# Patient Record
Sex: Female | Born: 1995 | Race: White | Hispanic: No | Marital: Single | Smoking: Never smoker
Health system: Southern US, Community
[De-identification: ages and names within clinical notes are randomized; demographics above are authoritative.]

## PROBLEM LIST (undated history)

## (undated) DIAGNOSIS — R519 Headache, unspecified: Secondary | ICD-10-CM

## (undated) DIAGNOSIS — F419 Anxiety disorder, unspecified: Secondary | ICD-10-CM

## (undated) DIAGNOSIS — T7840XA Allergy, unspecified, initial encounter: Secondary | ICD-10-CM

## (undated) HISTORY — DX: Allergy, unspecified, initial encounter: T78.40XA

## (undated) HISTORY — DX: Anxiety disorder, unspecified: F41.9

---
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
## 2017-08-23 LAB — HEPATIC FUNCTION PANEL
ALT: 8 (ref 7–35)
AST: 13 (ref 13–35)
Alkaline Phosphatase: 49 (ref 25–125)
Bilirubin, Total: 0.3

## 2017-08-23 LAB — CBC AND DIFFERENTIAL
HCT: 43 (ref 36–46)
Hemoglobin: 14.2 (ref 12.0–16.0)
Platelets: 291 (ref 150–399)
WBC: 6.4

## 2017-08-23 LAB — BASIC METABOLIC PANEL
BUN: 9 (ref 4–21)
Creatinine: 0.7 (ref 0.5–1.1)
Glucose: 67
Potassium: 3.9 (ref 3.4–5.3)
Sodium: 142 (ref 137–147)

## 2017-08-23 LAB — LIPID PANEL
Cholesterol: 195 (ref 0–200)
HDL: 67 (ref 35–70)
LDL Cholesterol: 110
Triglycerides: 85 (ref 40–160)

## 2017-08-23 LAB — IRON,TIBC AND FERRITIN PANEL
%SAT: 19
Ferritin: 36
Iron: 80
TIBC: 426
UIBC: 346

## 2017-08-23 LAB — VITAMIN B12: Vitamin B-12: 602

## 2017-08-23 LAB — TSH: TSH: 2.8 (ref 0.41–5.90)

## 2017-08-23 LAB — HEMOGLOBIN A1C: Hemoglobin A1C: 5.3

## 2017-08-23 LAB — VITAMIN D 25 HYDROXY (VIT D DEFICIENCY, FRACTURES): Vit D, 25-Hydroxy: 35.2

## 2018-04-15 ENCOUNTER — Telehealth: Payer: Self-pay

## 2018-04-15 NOTE — Telephone Encounter (Signed)
Copied from CRM [id number]. Topic: General - Other >> Apr 15, 2018  4:20 PM Jilda Roche wrote: Reason for CRM: Patient's mom, Makayla Calderon is a patient of Dr. Melina Schools , she is trying to establish care for her daughter and would like to know if she would take her as a new patient, please advise  Best call back is [phone] this is mom's number

## 2018-04-21 NOTE — Telephone Encounter (Signed)
Yes.  I will accept her daughter.   

## 2018-05-26 ENCOUNTER — Ambulatory Visit: Payer: BC Managed Care – PPO | Admitting: Internal Medicine

## 2018-05-26 ENCOUNTER — Other Ambulatory Visit: Payer: Self-pay

## 2018-05-26 ENCOUNTER — Encounter: Payer: Self-pay | Admitting: Internal Medicine

## 2018-05-26 DIAGNOSIS — F4010 Social phobia, unspecified: Secondary | ICD-10-CM | POA: Diagnosis not present

## 2018-05-26 DIAGNOSIS — K581 Irritable bowel syndrome with constipation: Secondary | ICD-10-CM | POA: Diagnosis not present

## 2018-05-26 DIAGNOSIS — Z3041 Encounter for surveillance of contraceptive pills: Secondary | ICD-10-CM | POA: Diagnosis not present

## 2018-05-26 MED ORDER — PAROXETINE HCL 10 MG PO TABS: 10.0000 mg | ORAL_TABLET | Freq: Every day | ORAL | 2 refills | Status: DC

## 2018-05-26 MED ORDER — PROPRANOLOL HCL 10 MG PO TABS: 5.0000 mg | ORAL_TABLET | Freq: Three times a day (TID) | ORAL | 0 refills | Status: DC

## 2018-05-26 NOTE — Patient Instructions (Addendum)
Your intermittent rectal bleeding sounds like you may have hemorrhoids from being constipated  Drink 60 ounces of water daily minimum to keep our  bowels hydrated  You can  use colace and or citrucel daily ,  And it's ok to try or add  magnesium capsules maximum 1 daily  (try 1 mag capsule and one colace)  Propranolol 1/2 tablet as needed before stressful situations (stage fright,  Etc)  Trial of generic Paxil for Generalized anxiety disorder:  Start with 1/2 tablet after dinner  Daily  for the first week.  Increase to a full tablet after one week if tolerating   Your bowel symptoms sound like Irritable Bowel syndrome  (see below)   Irritable Bowel Syndrome, Adult  Irritable bowel syndrome (IBS) is a group of symptoms that affects the organs responsible for digestion (gastrointestinal or GI tract). IBS is not one specific disease. To regulate how the GI tract works, the body sends signals back and forth between the intestines and the brain. If you have IBS, there may be a problem with these signals. As a result, the GI tract does not function normally. The intestines may become more sensitive and overreact to certain things. This may be especially true when you eat certain foods or when you are under stress. There are four types of IBS. These may be determined based on the consistency of your stool (feces):  IBS with diarrhea.  IBS with constipation.  Mixed IBS.  Unsubtyped IBS. It is important to know which type of IBS you have. Certain treatments are more likely to be helpful for certain types of IBS. What are the causes? The exact cause of IBS is not known. What increases the risk? You may have a higher risk for IBS if you:  Are female.  Are younger than 80.  Have a family history of IBS.  Have a mental health condition, such as depression, anxiety, or post-traumatic stress disorder.  Have had a bacterial infection of your GI tract. What are the signs or  symptoms? Symptoms of IBS vary from person to person. The main symptom is abdominal pain or discomfort. Other symptoms usually include one or more of the following:  Diarrhea, constipation, or both.  Abdominal swelling or bloating.  Feeling full after eating a small or regular-sized meal.  Frequent gas.  Mucus in the stool.  A feeling of having more stool left after a bowel movement. Symptoms tend to come and go. They may be triggered by stress, mental health conditions, or certain foods. How is this diagnosed? This condition may be diagnosed based on a physical exam, your medical history, and your symptoms. You may have tests, such as:  Blood tests.  Stool test.  X-rays.  CT scan.  Colonoscopy. This is a procedure in which your GI tract is viewed with a long, thin, flexible tube. How is this treated? There is no cure for IBS, but treatment can help relieve symptoms. Treatment depends on the type of IBS you have, and may include:  Changes to your diet, such as: ? Avoiding foods that cause symptoms. ? Drinking more water. ? Following a low-FODMAP (fermentable oligosaccharides, disaccharides, monosaccharides, and polyols) diet for up to 6 weeks, or as told by your health care provider. FODMAPs are sugars that are hard for some people to digest. ? Eating more fiber. ? Eating medium-sized meals at the same times every day.  Medicines. These may include: ? Fiber supplements, if you have constipation. ? Medicine to control  diarrhea (antidiarrheal medicines). ? Medicine to help control muscle tightening (spasms) in your GI tract (antispasmodic medicines). ? Medicines to help with mental health conditions, such as antidepressants or tranquilizers.  Talk therapy or counseling.  Working with a diet and nutrition specialist (dietitian) to help create a food plan that is right for you.  Managing your stress. Follow these instructions at home: Eating and drinking  Eat a healthy  diet.  Eat medium-sized meals at about the same time every day. Do not eat large meals.  Gradually eat more fiber-rich foods. These include whole grains, fruits, and vegetables. This may be especially helpful if you have IBS with constipation.  Eat a diet low in FODMAPs.  Drink enough fluid to keep your urine pale yellow.  Keep a journal of foods that seem to trigger symptoms.  Avoid foods and drinks that: ? Contain added sugar. ? Make your symptoms worse. Dairy products, caffeinated drinks, and carbonated drinks can make symptoms worse for some people. General instructions  Take over-the-counter and prescription medicines and supplements only as told by your health care provider.  Get enough exercise. Do at least 150 minutes of moderate-intensity exercise each week.  Manage your stress. Getting enough sleep and exercise can help you manage stress.  Keep all follow-up visits as told by your health care provider and therapist. This is important. Alcohol Use  Do not drink alcohol if: ? Your health care provider tells you not to drink. ? You are pregnant, may be pregnant, or are planning to become pregnant.  If you drink alcohol, limit how much you have: ? 0-1 drink a day for women. ? 0-2 drinks a day for men.  Be aware of how much alcohol is in your drink. In the [place] one drink equals one typical bottle of beer (12 oz), one-half glass of wine (5 oz), or one shot of hard liquor (1 oz). Contact a health care provider if you have:  Constant pain.  Weight loss.  Difficulty or pain when swallowing.  Diarrhea that gets worse. Get help right away if you have:  Severe abdominal pain.  Fever.  Diarrhea with symptoms of dehydration, such as dizziness or dry mouth.  Bright red blood in your stool.  Stool that is black and tarry.  Abdominal swelling.  Vomiting that does not stop.  Blood in your vomit. Summary  Irritable bowel syndrome (IBS) is not one specific  disease. It is a group of symptoms that affects digestion.  Your intestines may become more sensitive and overreact to certain things. This may be especially true when you eat certain foods or when you are under stress.  There is no cure for IBS, but treatment can help relieve symptoms. This information is not intended to replace advice given to you by your health care provider. Make sure you discuss any questions you have with your health care provider. Document Released: 02/12/2005 Document Revised: 02/05/2017 Document Reviewed: 02/05/2017 Elsevier Interactive Patient Education  2019 [employer].

## 2018-05-26 NOTE — Progress Notes (Signed)
Subjective:  Patient ID: Makayla Calderon, female    DOB: 03-05-1995  Age: 23 y.o. MRN: [id number]  CC: Diagnoses of Social anxiety disorder, Irritable bowel syndrome with constipation, and Oral contraceptive use were pertinent to this visit.  HPI  Makayla Calderon presents for new patient evaluation, referred by her mother Makayla Calderon.   She states that she has fully recoveredfrom a cold last week that presented with rhinitis and cough but without fever , body aches and dyspnea,  She denies any recent travel or exposure to infected patients and has been staying at home for the last 2 weeks since the restrictions went into place.   1) History of recurrent stomach aches dating back at least 5 years, (per ER records). History of constipation with occasional streaks of scant blood when wiping after hard stools.  Stool patterns and passage improves if she uses a stool softener , takes it every other day . Has not ad to use an  enema in years . No history of anemia,  Labs checked by fomrer PCP pediatrician at [facility]  Dr Olegario Shearer less than one year ago.   2) chronic anxiety disorder;  Mostly social anxiety ,  Occasionally stutters  or gets "tongue tied " at times.  Afraid of speaking to groups larger than 3 or 4 . Also gets  nervous making left turns and going through drive through's.   No history of trauma or MVA. "I've always been a nervous person." mom thinks she needs help .  previous PCP Landon offered psychotherapy but patient deferred due to work schedule .  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     3) using OCP to regulate periods .  Not sexually active.  Not sure if she had an HPV vaccine.  Taking Sprintec  By Dr Olegario Shearer.       Objective:  BP 136/68 (BP Location: Right Arm, Patient Position: Sitting, Cuff Size: Normal)   Pulse 90   Temp 98.7 F (37.1 C) (Oral)   Resp 15   Ht 5\' 5"  (1.651 m)   Wt 143 lb 6.4 oz  (65 kg)   SpO2 98%   BMI 23.86 kg/m   Physical Exam:  General appearance: alert, cooperative and appears stated age Ears: normal TM's and external ear canals both ears Throat: lips, mucosa, and tongue normal; teeth and gums normal Neck: no adenopathy, no carotid bruit, supple, symmetrical, trachea midline and thyroid not enlarged, symmetric, no tenderness/mass/nodules Back: symmetric, no curvature. ROM normal. No CVA tenderness. Lungs: clear to auscultation bilaterally Heart: regular rate and rhythm, S1, S2 normal, no murmur, click, rub or gallop Abdomen: soft, non-tender; bowel sounds normal; no masses,  no organomegaly Pulses: 2+ and symmetric Skin: Skin color, texture, turgor normal. No rashes or lesions Lymph nodes: Cervical, supraclavicular, and axillary nodes normal.  Assessment & Plan:   Problem List Items Addressed This Visit    Social anxiety disorder    Trial of low dose paroxetine starting with 5 mg qhs for first week only, then advance to 10 mg.  Adding prn use of propranolol for use in public speaking       Relevant Medications   PARoxetine (PAXIL) 10 MG tablet   Irritable bowel syndrome (IBS)    Constipation predominant.  Reassess in 3 months after starting paxil and adding citrucel.  May benefit form linzess.  Relevant Medications   docusate sodium (COLACE) 100 MG capsule   Oral contraceptive use    Used to maintain regular periods.  No history of sexual activity . Does not use tampons either . Will postpone first PAP smear until next year and refer to Dr Leeroy Bock Ward  at that time .  Annual liver enzyme assessment  needed in summer.           I am having Makayla Calderon start on propranolol and PARoxetine. I am also having her maintain her Sprintec 28, fluticasone, docusate sodium, cetirizine, and Multiple Vitamins-Minerals (MULTIVITAMIN ADULT PO).  Meds ordered this encounter  Medications  . propranolol (INDERAL) 10 MG tablet    Sig: Take 0.5 tablets  (5 mg total) by mouth 3 (three) times daily. As needed for rapid heart rate    Dispense:  30 tablet    Refill:  0  . PARoxetine (PAXIL) 10 MG tablet    Sig: Take 1 tablet (10 mg total) by mouth daily.    Dispense:  30 tablet    Refill:  2    There are no discontinued medications.  Follow-up: Return in about 3 months (around 08/26/2018) for annual exam,  no PAP.   Sherlene Shams, MD

## 2018-05-27 DIAGNOSIS — F4010 Social phobia, unspecified: Secondary | ICD-10-CM | POA: Insufficient documentation

## 2018-05-27 DIAGNOSIS — Z3041 Encounter for surveillance of contraceptive pills: Secondary | ICD-10-CM | POA: Insufficient documentation

## 2018-05-27 DIAGNOSIS — K589 Irritable bowel syndrome without diarrhea: Secondary | ICD-10-CM | POA: Insufficient documentation

## 2018-05-27 NOTE — Assessment & Plan Note (Addendum)
Used to maintain regular periods.  No history of sexual activity . Does not use tampons either . Will postpone first PAP smear until next year and refer to Dr Leeroy Bock Ward  at that time .  Annual liver enzyme assessment  needed in summer.

## 2018-05-27 NOTE — Assessment & Plan Note (Signed)
Trial of low dose paroxetine starting with 5 mg qhs for first week only, then advance to 10 mg.  Adding prn use of propranolol for use in public speaking

## 2018-05-27 NOTE — Assessment & Plan Note (Signed)
Constipation predominant.  Reassess in 3 months after starting paxil and adding citrucel.  May benefit form linzess.

## 2018-06-08 ENCOUNTER — Encounter: Payer: Self-pay | Admitting: Internal Medicine

## 2018-06-08 ENCOUNTER — Telehealth: Payer: Self-pay | Admitting: Internal Medicine

## 2018-06-08 DIAGNOSIS — Z3041 Encounter for surveillance of contraceptive pills: Secondary | ICD-10-CM

## 2018-06-18 ENCOUNTER — Other Ambulatory Visit: Payer: Self-pay | Admitting: Internal Medicine

## 2018-07-12 ENCOUNTER — Other Ambulatory Visit: Payer: Self-pay | Admitting: Internal Medicine

## 2018-08-25 ENCOUNTER — Telehealth: Payer: Self-pay

## 2018-08-25 NOTE — Telephone Encounter (Signed)
LMTCB. Need to screen pt for her in office appt tomorrow with Dr. Derrel Nip.

## 2018-08-26 ENCOUNTER — Encounter: Payer: Self-pay | Admitting: Internal Medicine

## 2018-08-26 ENCOUNTER — Ambulatory Visit (INDEPENDENT_AMBULATORY_CARE_PROVIDER_SITE_OTHER): Payer: BC Managed Care – PPO | Admitting: Internal Medicine

## 2018-08-26 ENCOUNTER — Other Ambulatory Visit: Payer: Self-pay

## 2018-08-26 VITALS — BP 104/72 | HR 100 | Temp 98.3°F | Resp 14 | Ht 65.0 in | Wt 143.0 lb

## 2018-08-26 DIAGNOSIS — Z3041 Encounter for surveillance of contraceptive pills: Secondary | ICD-10-CM

## 2018-08-26 DIAGNOSIS — K581 Irritable bowel syndrome with constipation: Secondary | ICD-10-CM | POA: Diagnosis not present

## 2018-08-26 DIAGNOSIS — F4010 Social phobia, unspecified: Secondary | ICD-10-CM

## 2018-08-26 DIAGNOSIS — Z0000 Encounter for general adult medical examination without abnormal findings: Secondary | ICD-10-CM

## 2018-08-26 NOTE — Patient Instructions (Addendum)
I'm glad the Paxil is helping!  You can stay on it as long as you feel it is helping.    There are 4 categories of laxatives.  They can be combined,  But some should not be used daily .  Bulk  forming laxatives   Ok to use daily  Citrucel, benefiber, metamucil, Fibercon, miralax)  Stool softener (docusate ; there's only one) :  ok to use daily    Stimulant laxatives (Ex Lax,  Correctol,  Senna,  Dulcolax)  :  avoid on a daily basis, not more than 2/weel   Cathartic laxatives ( MOM,  Mag citrate, Lactulose ) : not more than 2 /week    Ok to take plain  magnesium in capsule form   With or without the stool softener daily     Health Maintenance, Female Adopting a healthy lifestyle and getting preventive care are important in promoting health and wellness. Ask your health care provider about:  The right schedule for you to have regular tests and exams.  Things you can do on your own to prevent diseases and keep yourself healthy. What should I know about diet, weight, and exercise? Eat a healthy diet   Eat a diet that includes plenty of vegetables, fruits, low-fat dairy products, and lean protein.  Do not eat a lot of foods that are high in solid fats, added sugars, or sodium. Maintain a healthy weight Body mass index (BMI) is used to identify weight problems. It estimates body fat based on height and weight. Your health care provider can help determine your BMI and help you achieve or maintain a healthy weight. Get regular exercise Get regular exercise. This is one of the most important things you can do for your health. Most adults should:  Exercise for at least 150 minutes each week. The exercise should increase your heart rate and make you sweat (moderate-intensity exercise).  Do strengthening exercises at least twice a week. This is in addition to the moderate-intensity exercise.  Spend less time sitting. Even light physical activity can be beneficial. Watch cholesterol and  blood lipids Have your blood tested for lipids and cholesterol at 21 years of age, then have this test every 5 years. Have your cholesterol levels checked more often if:  Your lipid or cholesterol levels are high.  You are older than 21 years of age.  You are at high risk for heart disease. What should I know about cancer screening? Depending on your health history and family history, you may need to have cancer screening at various ages. This may include screening for:  Breast cancer.  Cervical cancer.  Colorectal cancer.  Skin cancer.  Lung cancer. What should I know about heart disease, diabetes, and high blood pressure? Blood pressure and heart disease  High blood pressure causes heart disease and increases the risk of stroke. This is more likely to develop in people who have high blood pressure readings, are of African descent, or are overweight.  Have your blood pressure checked: ? Every 3-5 years if you are 6518-21 years of age. ? Every year if you are 412 years old or older. Diabetes Have regular diabetes screenings. This checks your fasting blood sugar level. Have the screening done:  Once every three years after age 21 if you are at a normal weight and have a low risk for diabetes.  More often and at a younger age if you are overweight or have a high risk for diabetes. What should I know  about preventing infection? Hepatitis B If you have a higher risk for hepatitis B, you should be screened for this virus. Talk with your health care provider to find out if you are at risk for hepatitis B infection. Hepatitis C Testing is recommended for:  Everyone born from [birth year] through 1963.  Anyone with known risk factors for hepatitis C. Sexually transmitted infections (STIs)  Get screened for STIs, including gonorrhea and chlamydia, if: ? You are sexually active and are younger than 21 years of age. ? You are older than 21 years of age and your health care provider tells  you that you are at risk for this type of infection. ? Your sexual activity has changed since you were last screened, and you are at increased risk for chlamydia or gonorrhea. Ask your health care provider if you are at risk.  Ask your health care provider about whether you are at high risk for HIV. Your health care provider may recommend a prescription medicine to help prevent HIV infection. If you choose to take medicine to prevent HIV, you should first get tested for HIV. You should then be tested every 3 months for as long as you are taking the medicine. Pregnancy  If you are about to stop having your period (premenopausal) and you may become pregnant, seek counseling before you get pregnant.  Take 400 to 800 micrograms (mcg) of folic acid every day if you become pregnant.  Ask for birth control (contraception) if you want to prevent pregnancy. Osteoporosis and menopause Osteoporosis is a disease in which the bones lose minerals and strength with aging. This can result in bone fractures. If you are 80 years old or older, or if you are at risk for osteoporosis and fractures, ask your health care provider if you should:  Be screened for bone loss.  Take a calcium or vitamin D supplement to lower your risk of fractures.  Be given hormone replacement therapy (HRT) to treat symptoms of menopause. Follow these instructions at home: Lifestyle  Do not use any products that contain nicotine or tobacco, such as cigarettes, e-cigarettes, and chewing tobacco. If you need help quitting, ask your health care provider.  Do not use street drugs.  Do not share needles.  Ask your health care provider for help if you need support or information about quitting drugs. Alcohol use  Do not drink alcohol if: ? Your health care provider tells you not to drink. ? You are pregnant, may be pregnant, or are planning to become pregnant.  If you drink alcohol: ? Limit how much you use to 0-1 drink a  day. ? Limit intake if you are breastfeeding.  Be aware of how much alcohol is in your drink. In the [place] one drink equals one 12 oz bottle of beer (355 mL), one 5 oz glass of wine (148 mL), or one 1 oz glass of hard liquor (44 mL). General instructions  Schedule regular health, dental, and eye exams.  Stay current with your vaccines.  Tell your health care provider if: ? You often feel depressed. ? You have ever been abused or do not feel safe at home. Summary  Adopting a healthy lifestyle and getting preventive care are important in promoting health and wellness.  Follow your health care provider's instructions about healthy diet, exercising, and getting tested or screened for diseases.  Follow your health care provider's instructions on monitoring your cholesterol and blood pressure. This information is not intended to replace advice given to  you by your health care provider. Make sure you discuss any questions you have with your health care provider. Document Released: 08/28/2010 Document Revised: 02/05/2018 Document Reviewed: 02/05/2018 Elsevier Patient Education  2020 [employer]

## 2018-08-26 NOTE — Progress Notes (Signed)
Patient ID: Makayla Calderon, female    DOB: 30-Aug-1995  Age: 23 y.o. MRN: [id number]  The patient is here for annual preventive  examination and management of other chronic and acute problems.   The risk factors are reflected in the social history.  The roster of all physicians providing medical care to patient - is listed in the Snapshot section of the chart.  Activities of daily living:  The patient is 100% independent in all ADLs: dressing, toileting, feeding as well as independent mobility  Home safety : The patient has smoke detectors in the home. They wear seatbelts.  There are no firearms at home. There is no violence in the home.   There is no risks for hepatitis, STDs or HIV. There is no   history of blood transfusion. They have no travel history to infectious disease endemic areas of the world.  The patient has seen their dentist in the last six month. They have seen their eye doctor in the last year.   They do not  have excessive sun exposure. Discussed the need for sun protection: hats, long sleeves and use of sunscreen if there is significant sun exposure. She does not smoke, use illicit drugs, or drink more than one alcoholic beverage per night   Diet: the importance of a healthy diet is discussed. They do have a healthy diet.  The benefits of regular aerobic exercise were discussed. She exercises  4  times per week ,  For about 60 minutes.   Depression screen: there are no signs or vegative symptoms of depression- irritability, change in appetite, anhedonia, sadness/tearfullness.   The following portions of the patient's history were reviewed and updated as appropriate: allergies, current medications, past family history, past medical history,  past surgical history, past social history  and problem list.  Visual acuity was not assessed per patient preference since she has regular follow up with her ophthalmologist. Hearing and body mass index were assessed and reviewed.   CC:  The primary encounter diagnosis was Oral contraceptive use. Diagnoses of Irritable bowel syndrome with constipation, Social anxiety disorder, and Encounter for preventive health examination were also pertinent to this visit.   Social anxiety:  At her last (first ) visit in march she was prescribed paroxetine for daily use and low dose propanolol for prn use during presentations.  She states that initially the paxil made her sleepy.  But the side effect resolve and she is tolerating and  benefiting from it. . She has used the beta blocker once thus far and it helped.  She has been out of school since late March because of the pandemic but is working at the [employer] using social distancing and masking.  Bowels moving better with a daily stool softener. No rectal bleeding.   [name] has a past medical history of Allergy.   She has a past surgical history that includes Tonsillectomy (2006).   Her family history includes Arthritis in her maternal grandmother; Asthma in her maternal grandmother and mother; Cancer in her brother, father, maternal grandfather, maternal grandmother, and paternal grandmother; Diabetes in her maternal grandfather; Early death in her paternal grandfather; Hearing loss in her maternal grandmother; Heart attack in her paternal grandfather and paternal grandmother; Heart disease in her maternal grandfather; Hyperlipidemia in her maternal grandfather and maternal grandmother; Hypertension in her maternal grandfather and maternal grandmother; Kidney disease in her paternal grandmother.She reports that she has never smoked. She has never used smokeless tobacco. She  reports current alcohol use. She reports that she does not use drugs.  Outpatient Medications Prior to Visit  Medication Sig Dispense Refill  . cetirizine (ZYRTEC) 10 MG tablet Take 10 mg by mouth daily.    Marland Kitchen docusate sodium (COLACE) 100 MG capsule Take 100 mg by mouth 2 (two) times daily.    .  fluticasone (FLONASE) 50 MCG/ACT nasal spray Place 1 spray into both nostrils daily.    . Multiple Vitamins-Minerals (MULTIVITAMIN ADULT PO) Take 1 tablet by mouth daily.    Marland Kitchen PARoxetine (PAXIL) 10 MG tablet TAKE 1 TABLET BY MOUTH EVERY DAY 90 tablet 1  . propranolol (INDERAL) 10 MG tablet TAKE 0.5 TABLETS (5 MG TOTAL) BY MOUTH 3 (THREE) TIMES DAILY. AS NEEDED FOR RAPID HEART RATE 30 tablet 0  . SPRINTEC 28 0.25-35 MG-MCG tablet Take 1 tablet by mouth daily.     No facility-administered medications prior to visit.     Review of Systems   Patient denies headache, fevers, malaise, unintentional weight loss, skin rash, eye pain, sinus congestion and sinus pain, sore throat, dysphagia,  hemoptysis , cough, dyspnea, wheezing, chest pain, palpitations, orthopnea, edema, abdominal pain, nausea, melena, diarrhea, constipation, flank pain, dysuria, hematuria, urinary  Frequency, nocturia, numbness, tingling, seizures,  Focal weakness, Loss of consciousness,  Tremor, insomnia, depression, anxiety, and suicidal ideation.      Objective:  BP 104/72 (BP Location: Left Arm, Patient Position: Sitting, Cuff Size: Normal)   Pulse 100   Temp 98.3 F (36.8 C) (Oral)   Resp 14   Ht _0  (1.651 m)   Wt 143 lb (64.9 kg)   SpO2 97%   BMI 23.80 kg/m   Physical Exam   General appearance: alert, cooperative and appears stated age Head: Normocephalic, without obvious abnormality, atraumatic Eyes: conjunctivae/corneas clear. PERRL, EOM's intact. Fundi benign. Ears: normal TM's and external ear canals both ears Nose: Nares normal. Septum midline. Mucosa normal. No drainage or sinus tenderness. Throat: lips, mucosa, and tongue normal; teeth and gums normal Neck: no adenopathy, no carotid bruit, no JVD, supple, symmetrical, trachea midline and thyroid not enlarged, symmetric, no tenderness/mass/nodules Lungs: clear to auscultation bilaterally Breasts: normal appearance, no masses or tenderness Heart: regular  rate and rhythm, S1, S2 normal, no murmur, click, rub or gallop Abdomen: soft, non-tender; bowel sounds normal; no masses,  no organomegaly Extremities: extremities normal, atraumatic, no cyanosis or edema Pulses: 2+ and symmetric Skin: Skin color, texture, turgor normal. No rashes or lesions Neurologic: Alert and oriented X 3, normal strength and tone. Normal symmetric reflexes. Normal coordination and gait.      Assessment & Plan:   Problem List Items Addressed This Visit    Encounter for preventive health examination    age appropriate education and counseling updated, referrals for preventative services and immunizations addressed, dietary and smoking counseling addressed, most recent labs reviewed.  I have personally reviewed and have noted:  1) the patient's medical and social history 2) The pt's use of alcohol, tobacco, and illicit drugs 3) The patient's current medications and supplements 4) Functional ability including ADL's, fall risk, home safety risk, hearing and visual impairment 5) Diet and physical activities 6) Evidence for depression or mood disorder 7) The patient's height, weight, and BMI have been recorded in the chart  I have made referrals, and provided counseling and education based on review of the above      Irritable bowel syndrome (IBS)    Improving bowel movement pattern with use of stool softener.  Encouraged to  increase the fiber in her diet to 25 g daily using Fruit and vegetables , [employer] and Atkins protein bars , and the low carb breads .  Also recommended adding  miralax, metamucil, fibercon, or citrucel daily to supplement her stool softener . She was advised that she  can also combine them daily with colace, and to increase water intake to 60 ounces daily       Oral contraceptive use - Primary    She remains celibate and uses the ocps to regulate menses.  Liver enzymes ordered      Relevant Orders   Comp Met (CMET)   Social anxiety  disorder    Improved functioning with paxil and prn inderal .  no changes today          I am having Makayla Calderon maintain her Sprintec 28, fluticasone, docusate sodium, cetirizine, Multiple Vitamins-Minerals (MULTIVITAMIN ADULT PO), PARoxetine, and propranolol.  No orders of the defined types were placed in this encounter.   There are no discontinued medications.  Follow-up: No follow-ups on file.   Crecencio Mc, MD

## 2018-08-27 DIAGNOSIS — Z0000 Encounter for general adult medical examination without abnormal findings: Secondary | ICD-10-CM | POA: Insufficient documentation

## 2018-08-27 LAB — COMPREHENSIVE METABOLIC PANEL
ALT: 11 U/L (ref 0–35)
AST: 19 U/L (ref 0–37)
Albumin: 4 g/dL (ref 3.5–5.2)
Alkaline Phosphatase: 40 U/L (ref 39–117)
BUN: 10 mg/dL (ref 6–23)
CO2: 24 mEq/L (ref 19–32)
Calcium: 9.1 mg/dL (ref 8.4–10.5)
Chloride: 107 mEq/L (ref 96–112)
Creatinine, Ser: 0.72 mg/dL (ref 0.40–1.20)
GFR: 101.87 mL/min (ref >60.00)
Glucose, Bld: 85 mg/dL (ref 70–99)
Potassium: 4.2 mEq/L (ref 3.5–5.1)
Sodium: 140 mEq/L (ref 135–145)
Total Bilirubin: 0.3 mg/dL (ref 0.2–1.2)
Total Protein: 6.8 g/dL (ref 6.0–8.3)

## 2018-08-27 NOTE — Assessment & Plan Note (Signed)
Improving bowel movement pattern with use of stool softener.   Encouraged to  increase the fiber in her diet to 25 g daily using Fruit and vegetables , [employer] and Atkins protein bars , and the low carb breads .  Also recommended adding  miralax, metamucil, fibercon, or citrucel daily to supplement her stool softener . She was advised that she  can also combine them daily with colace, and to increase water intake to 60 ounces daily

## 2018-08-27 NOTE — Assessment & Plan Note (Addendum)
She remains celibate and uses the ocps to regulate menses.  Liver enzymes ordered 

## 2018-08-27 NOTE — Assessment & Plan Note (Signed)

## 2018-08-27 NOTE — Assessment & Plan Note (Signed)
Improved functioning with paxil and prn inderal .  no changes today

## 2018-12-16 ENCOUNTER — Other Ambulatory Visit: Payer: Self-pay | Admitting: Internal Medicine

## 2019-04-17 MED ORDER — SPRINTEC 28 0.25-35 MG-MCG PO TABS: 1.0000 | ORAL_TABLET | Freq: Every day | ORAL | 11 refills | Status: DC

## 2019-04-17 NOTE — Telephone Encounter (Signed)
Has not been filled by you.  Last OV: 08/26/2018 Next OV: not scheduled

## 2019-06-19 ENCOUNTER — Other Ambulatory Visit: Payer: Self-pay | Admitting: Internal Medicine

## 2019-07-08 ENCOUNTER — Telehealth: Payer: Self-pay | Admitting: Internal Medicine

## 2019-07-08 NOTE — Telephone Encounter (Signed)
Tell her to resume zyrtec,  Or if still taking it,  Add a 2nd dose 12 hours later and continue flonase  To see if we can get symptoms under control   Yes I will see her on the 24 th as long as she has no other symptoms suggestive of covid

## 2019-07-08 NOTE — Telephone Encounter (Signed)
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 I scheduled her an appt for 5/24. Then she scheduled a physical for July. Before I was ready to get off phone with pt she then started talking about her allergies. She said she wants to know if she can be seen for them in office with the appt on 5/24? I let her know that this would probably need to be a separate visit and that I would ask but with her saying she is blowing her nose every few minutes then I didn't think she would be able too.

## 2019-07-08 NOTE — Telephone Encounter (Signed)
Called pt to get a little more information. Pt stated that for about a month and a half she has been having a runny nose, constantly blowing her nose, occasional sneezing, itchy eyes, no cough, no fever no SOBr or any other symptoms. Is it okay if pt comes in of her appt on 07/20/2019. 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

## 2019-07-09 ENCOUNTER — Telehealth: Payer: Self-pay | Admitting: Internal Medicine

## 2019-07-09 NOTE — Telephone Encounter (Signed)
Spoke with pt and she is aware of dr. Melina Schools advise below.

## 2019-07-09 NOTE — Telephone Encounter (Signed)
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 Forms are up front in Dr. Melina Schools color folder.

## 2019-07-11 ENCOUNTER — Other Ambulatory Visit: Payer: Self-pay | Admitting: Internal Medicine

## 2019-07-13 NOTE — Telephone Encounter (Signed)
Forms have been placed in folder on my desk to be held until her appt on 07/20/2019.

## 2019-07-20 ENCOUNTER — Other Ambulatory Visit: Payer: Self-pay

## 2019-07-20 ENCOUNTER — Encounter: Payer: Self-pay | Admitting: Internal Medicine

## 2019-07-20 ENCOUNTER — Ambulatory Visit (INDEPENDENT_AMBULATORY_CARE_PROVIDER_SITE_OTHER): Payer: BC Managed Care – PPO | Admitting: Internal Medicine

## 2019-07-20 VITALS — BP 126/78 | HR 107 | Temp 98.7°F | Resp 16 | Ht 65.0 in | Wt 153.8 lb

## 2019-07-20 DIAGNOSIS — Z23 Encounter for immunization: Secondary | ICD-10-CM | POA: Diagnosis not present

## 2019-07-20 DIAGNOSIS — R5383 Other fatigue: Secondary | ICD-10-CM | POA: Diagnosis not present

## 2019-07-20 DIAGNOSIS — F329 Major depressive disorder, single episode, unspecified: Secondary | ICD-10-CM | POA: Insufficient documentation

## 2019-07-20 DIAGNOSIS — K581 Irritable bowel syndrome with constipation: Secondary | ICD-10-CM | POA: Diagnosis not present

## 2019-07-20 DIAGNOSIS — Z0000 Encounter for general adult medical examination without abnormal findings: Secondary | ICD-10-CM

## 2019-07-20 NOTE — Patient Instructions (Addendum)
We will do fasting labs prior to your CPE in July  You received the Td vAccine Suzan Slick today  Please add the date and a copy of your quantiferon gold to your entrance papers     To manage your daytime sleepiness and oversleeping :  1) morning exercise  2) lunch meal should be low carb  3) consider setting an  8 hour alarm and use of melatonin at dinner time

## 2019-07-20 NOTE — Assessment & Plan Note (Signed)
encouraged to use IB Guard as a trial

## 2019-07-20 NOTE — Progress Notes (Signed)
Subjective:  Patient ID: Makayla Calderon, female    DOB: 12/07/1995  Age: 24 y.o. MRN: [id number]  CC: The primary encounter diagnosis was Encounter for preventive health examination. Diagnoses of Need for Td vaccine, Fatigue, unspecified type, and Irritable bowel syndrome with constipation were also pertinent to this visit.  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   Fatigue;  Late afternoon  Gets sleepy .  Sleep schedule is hectic, but no trouble falling asleep.  Sometimes gets 6 hours , sometimes 9 to 10 .  Not exercising regularly.    immuniZATION RECORD REVIEWED.  SHE IS UP TO DATE ON ALL BUT NEEDS tD BOOSTER TODAY  Received TB screening TB with quantiferon Gold last week at [facility]   Outpatient Medications Prior to Visit  Medication Sig Dispense Refill  . cetirizine (ZYRTEC) 10 MG tablet Take 10 mg by mouth 2 (two) times daily.     Marland Kitchen docusate sodium (COLACE) 100 MG capsule Take 100 mg by mouth 2 (two) times daily.    . fluticasone (FLONASE) 50 MCG/ACT nasal spray Place 1 spray into both nostrils daily.    . Multiple Vitamins-Minerals (MULTIVITAMIN ADULT PO) Take 1 tablet by mouth daily.    Marland Kitchen PARoxetine (PAXIL) 10 MG tablet TAKE 1 TABLET BY MOUTH EVERY DAY 90 tablet 1  . propranolol (INDERAL) 10 MG tablet TAKE 0.5 TABLETS (5 MG TOTAL) BY MOUTH 3 (THREE) TIMES DAILY. AS NEEDED FOR RAPID HEART RATE 30 tablet 0  . SPRINTEC 28 0.25-35 MG-MCG tablet Take 1 tablet by mouth daily. 1 Package 11   No facility-administered medications prior to visit.    Review of Systems;  Patient denies headache, fevers, malaise, unintentional weight loss, skin rash, eye pain, sinus congestion and sinus pain, sore throat, dysphagia,  hemoptysis , cough, dyspnea, wheezing, chest pain, palpitations, orthopnea, edema, abdominal pain, nausea, melena, diarrhea, constipation, flank  pain, dysuria, hematuria, urinary  Frequency, nocturia, numbness, tingling, seizures,  Focal weakness, Loss of consciousness,  Tremor, insomnia, depression, anxiety, and suicidal ideation.      Objective:  BP 126/78 (BP Location: Left Arm, Patient Position: Sitting, Cuff Size: Normal)   Pulse (!) 107   Temp 98.7 F (37.1 C) (Temporal)   Resp 16   Ht 5\' 5"  (1.651 m)   Wt 153 lb 12.8 oz (69.8 kg)   SpO2 97%   BMI 25.59 kg/m   BP Readings from Last 3 Encounters:  07/20/19 126/78  08/26/18 104/72  05/26/18 136/68    Wt Readings from Last 3 Encounters:  07/20/19 153 lb 12.8 oz (69.8 kg)  08/26/18 143 lb (64.9 kg)  05/26/18 143 lb 6.4 oz (65 kg)    General appearance: alert, cooperative and appears stated age Ears: normal TM's and external ear canals both ears Throat: lips, mucosa, and tongue normal; teeth and gums normal Neck: no adenopathy, no carotid bruit, supple, symmetrical, trachea midline and thyroid not enlarged, symmetric, no tenderness/mass/nodules Back: symmetric, no curvature. ROM normal. No CVA tenderness. Lungs: clear to auscultation bilaterally Heart: regular rate and rhythm, S1, S2 normal, no murmur, click, rub or gallop Abdomen: soft, non-tender; bowel sounds normal; no masses,  no organomegaly Pulses: 2+ and symmetric Skin: Skin color, texture, turgor normal. No rashes or lesions Lymph nodes: Cervical, supraclavicular, and axillary nodes normal.  Lab Results  Component Value Date  HGBA1C 5.3 08/23/2017    Lab Results  Component Value Date   CREATININE 0.72 08/26/2018   CREATININE 0.7 08/23/2017   CREATININE 0.59 (L) 05/30/2013    Lab Results  Component Value Date   WBC 6.4 08/23/2017   HGB 14.2 08/23/2017   HCT 43 08/23/2017   PLT 291 08/23/2017   GLUCOSE 85 08/26/2018   CHOL 195 08/23/2017   TRIG 85 08/23/2017   HDL 67 08/23/2017   LDLCALC 110 08/23/2017   ALT 11 08/26/2018   AST 19 08/26/2018   NA 140 08/26/2018   K 4.2 08/26/2018    CL 107 08/26/2018   CREATININE 0.72 08/26/2018   BUN 10 08/26/2018   CO2 24 08/26/2018   TSH 2.80 08/23/2017   HGBA1C 5.3 08/23/2017    No results found.  Assessment & Plan:   Problem List Items Addressed This Visit      Unprioritized   Encounter for preventive health examination - Primary   Relevant Orders   Comprehensive metabolic panel   TSH   CBC with Differential/Platelet   Lipid panel   Fatigue    Daytime sleepiness .  Will screen for thyroid, anemia,  hepatic and renal insufficiency, encourage regular exercise 5 days /week,  Use of melatonin to regulate sleep cycles,  and low glycemic index lunch      Irritable bowel syndrome (IBS)    encouraged to use IB Guard as a trial        Other Visit Diagnoses    Need for Td vaccine       Relevant Orders   Td : Tetanus/diphtheria >7yo Preservative  free (Completed)      I am having Tanijah M. Dorrough maintain her fluticasone, docusate sodium, cetirizine, Multiple Vitamins-Minerals (MULTIVITAMIN ADULT PO), propranolol, Sprintec 28, and PARoxetine.  No orders of the defined types were placed in this encounter.   There are no discontinued medications.  Follow-up: No follow-ups on file.   Sherlene Shams, MD

## 2019-07-20 NOTE — Assessment & Plan Note (Signed)
Daytime sleepiness .  Will screen for thyroid, anemia,  hepatic and renal insufficiency, encourage regular exercise 5 days /week,  Use of melatonin to regulate sleep cycles,  and low glycemic index lunch

## 2019-08-27 DIAGNOSIS — G4710 Hypersomnia, unspecified: Secondary | ICD-10-CM | POA: Insufficient documentation

## 2019-08-28 ENCOUNTER — Encounter: Payer: BC Managed Care – PPO | Admitting: Internal Medicine

## 2019-09-03 ENCOUNTER — Encounter: Payer: BC Managed Care – PPO | Admitting: Internal Medicine

## 2019-09-10 ENCOUNTER — Encounter: Payer: BC Managed Care – PPO | Admitting: Internal Medicine

## 2019-09-18 ENCOUNTER — Encounter: Payer: Self-pay | Admitting: Internal Medicine

## 2019-09-18 ENCOUNTER — Other Ambulatory Visit (HOSPITAL_COMMUNITY)
Admission: RE | Admit: 2019-09-18 | Discharge: 2019-09-18 | Disposition: A | Discharge status: Home / Self Care | Payer: BC Managed Care – PPO | Source: Ambulatory Visit | Attending: Internal Medicine | Admitting: Internal Medicine

## 2019-09-18 ENCOUNTER — Ambulatory Visit (INDEPENDENT_AMBULATORY_CARE_PROVIDER_SITE_OTHER): Payer: BC Managed Care – PPO | Admitting: Internal Medicine

## 2019-09-18 ENCOUNTER — Other Ambulatory Visit: Payer: Self-pay

## 2019-09-18 VITALS — BP 120/70 | HR 94 | Temp 98.7°F | Resp 16 | Ht 65.0 in | Wt 151.6 lb

## 2019-09-18 DIAGNOSIS — Z124 Encounter for screening for malignant neoplasm of cervix: Secondary | ICD-10-CM | POA: Insufficient documentation

## 2019-09-18 DIAGNOSIS — N761 Subacute and chronic vaginitis: Secondary | ICD-10-CM

## 2019-09-18 DIAGNOSIS — Z3041 Encounter for surveillance of contraceptive pills: Secondary | ICD-10-CM | POA: Diagnosis not present

## 2019-09-18 DIAGNOSIS — R5383 Other fatigue: Secondary | ICD-10-CM

## 2019-09-18 DIAGNOSIS — Z0000 Encounter for general adult medical examination without abnormal findings: Secondary | ICD-10-CM

## 2019-09-18 LAB — COMPREHENSIVE METABOLIC PANEL
AG Ratio: 1.6 (calc) (ref 1.0–2.5)
ALT: 8 U/L (ref 6–29)
AST: 12 U/L (ref 10–30)
Albumin: 4.2 g/dL (ref 3.6–5.1)
Alkaline phosphatase (APISO): 55 U/L (ref 31–125)
BUN: 11 mg/dL (ref 7–25)
CO2: 24 mmol/L (ref 20–32)
Calcium: 9.8 mg/dL (ref 8.6–10.2)
Chloride: 104 mmol/L (ref 98–110)
Creat: 0.62 mg/dL (ref 0.50–1.10)
Globulin: 2.7 g/dL (calc) (ref 1.9–3.7)
Glucose, Bld: 79 mg/dL (ref 65–99)
Potassium: 4 mmol/L (ref 3.5–5.3)
Sodium: 138 mmol/L (ref 135–146)
Total Bilirubin: 0.3 mg/dL (ref 0.2–1.2)
Total Protein: 6.9 g/dL (ref 6.1–8.1)

## 2019-09-18 LAB — TSH: TSH: 1.74 mIU/L

## 2019-09-18 NOTE — Progress Notes (Signed)
Patient ID: Makayla Calderon, female    DOB: 1995-12-31  Age: 24 y.o. MRN: [id number]  The patient is here for annual PREVENTIVE  examination and management of other chronic and acute problems.   The risk factors are reflected in the social history.  The roster of all physicians providing medical care to patient - is listed in the Snapshot section of the chart.  Activities of daily living:  The patient is 100% independent in all ADLs: dressing, toileting, feeding as well as independent mobility  Home safety : The patient has smoke detectors in the home. They wear seatbelts.  There are no firearms at home. There is no violence in the home.   There is no risks for hepatitis, STDs or HIV. There is no   history of blood transfusion. They have no travel history to infectious disease endemic areas of the world.  The patient has seen their dentist in the last six month. They have seen their eye doctor in the last year. .  They do not  have excessive sun exposure. Discussed the need for sun protection: hats, long sleeves and use of sunscreen if there is significant sun exposure.   Diet: the importance of a healthy diet is discussed. They do have a healthy diet.  The benefits of regular aerobic exercise were discussed. She is not currently exercising regularly.   Depression screen: there are no signs or vegative symptoms of depression- irritability, change in appetite, anhedonia, sadness/tearfullness.  The following portions of the patient's history were reviewed and updated as appropriate: allergies, current medications, past family history, past medical history,  past surgical history, past social history  and problem list.  Visual acuity was not assessed per patient preference since she has regular follow up with her ophthalmologist. Hearing and body mass index were assessed and reviewed.   During the course of the visit the patient was educated and counseled about appropriate screening and preventive  services including : fall prevention , diabetes screening, nutrition counseling, colorectal cancer screening, and recommended immunizations.    CC: The primary encounter diagnosis was Fatigue, unspecified type. Diagnoses of Cervical cancer screening, Chronic vaginitis, Oral contraceptive use, and Encounter for preventive health examination were also pertinent to this visit.  [name] has a past medical history of Allergy.   She has a past surgical history that includes Tonsillectomy (2006).   Her family history includes Arthritis in her maternal grandmother; Asthma in her maternal grandmother and mother; Cancer in her brother, father, maternal grandfather, maternal grandmother, and paternal grandmother; Diabetes in her maternal grandfather; Early death in her paternal grandfather; Hearing loss in her maternal grandmother; Heart attack in her paternal grandfather and paternal grandmother; Heart disease in her maternal grandfather; Hyperlipidemia in her maternal grandfather and maternal grandmother; Hypertension in her maternal grandfather and maternal grandmother; Kidney disease in her paternal grandmother.She reports that she has never smoked. She has never used smokeless tobacco. She reports current alcohol use. She reports that she does not use drugs.  Outpatient Medications Prior to Visit  Medication Sig Dispense Refill  . cetirizine (ZYRTEC) 10 MG tablet Take 10 mg by mouth 2 (two) times daily.     Marland Kitchen docusate sodium (COLACE) 100 MG capsule Take 100 mg by mouth 2 (two) times daily.    . fluticasone (FLONASE) 50 MCG/ACT nasal spray Place 1 spray into both nostrils daily.    . Multiple Vitamins-Minerals (MULTIVITAMIN ADULT PO) Take 1 tablet by mouth daily.    Marland Kitchen PARoxetine (PAXIL)  10 MG tablet TAKE 1 TABLET BY MOUTH EVERY DAY 90 tablet 1  . propranolol (INDERAL) 10 MG tablet TAKE 0.5 TABLETS (5 MG TOTAL) BY MOUTH 3 (THREE) TIMES DAILY. AS NEEDED FOR RAPID HEART RATE 30 tablet 0  . SPRINTEC 28  0.25-35 MG-MCG tablet Take 1 tablet by mouth daily. 1 Package 11   No facility-administered medications prior to visit.    Review of Systems   Patient denies headache, fevers, malaise, unintentional weight loss, skin rash, eye pain, sinus congestion and sinus pain, sore throat, dysphagia,  hemoptysis , cough, dyspnea, wheezing, chest pain, palpitations, orthopnea, edema, abdominal pain, nausea, melena, diarrhea, constipation, flank pain, dysuria, hematuria, urinary  Frequency, nocturia, numbness, tingling, seizures,  Focal weakness, Loss of consciousness,  Tremor, insomnia, depression, anxiety, and suicidal ideation.      Objective:  BP 120/70 (BP Location: Left Arm, Patient Position: Sitting, Cuff Size: Normal)   Pulse 94   Temp 98.7 F (37.1 C) (Oral)   Resp 16   Ht 5\' 5"  (1.651 m)   Wt 151 lb 9.6 oz (68.8 kg)   SpO2 99%   BMI 25.23 kg/m   Physical Exam   General Appearance:    Alert, cooperative, no distress, appears stated age  Head:    Normocephalic, without obvious abnormality, atraumatic  Eyes:    PERRL, conjunctiva/corneas clear, EOM's intact, fundi    benign, both eyes  Ears:    Normal TM's and external ear canals, both ears  Nose:   Nares normal, septum midline, mucosa normal, no drainage    or sinus tenderness  Throat:   Lips, mucosa, and tongue normal; teeth and gums normal  Neck:   Supple, symmetrical, trachea midline, no adenopathy;    thyroid:  no enlargement/tenderness/nodules; no carotid   bruit or JVD  Back:     Symmetric, no curvature, ROM normal, no CVA tenderness  Lungs:     Clear to auscultation bilaterally, respirations unlabored  Chest Wall:    No tenderness or deformity   Heart:    Regular rate and rhythm, S1 and S2 normal, no murmur, rub   or gallop  Breast Exam:    No tenderness, masses, or nipple abnormality  Abdomen:     Soft, non-tender, bowel sounds active all four quadrants,    no masses, no organomegaly  Genitalia:    Pelvic: cervix normal  in appearance, external genitalia normal, no adnexal masses or tenderness, no cervical motion tenderness, rectovaginal septum normal, uterus normal size, shape, and consistency and vagina normal without discharge  Extremities:   Extremities normal, atraumatic, no cyanosis or edema  Pulses:   2+ and symmetric all extremities  Skin:   Skin color, texture, turgor normal, no rashes or lesions  Lymph nodes:   Cervical, supraclavicular, and axillary nodes normal  Neurologic:   CNII-XII intact, normal strength, sensation and reflexes    throughout     Assessment & Plan:   Problem List Items Addressed This Visit      Unprioritized   Oral contraceptive use    She remains celibate and uses the ocps to regulate menses.  Liver enzymes ordered      Fatigue - Primary   Relevant Orders   Comprehensive metabolic panel (Completed)   TSH (Completed)   Encounter for preventive health examination    age appropriate education and counseling updated, referrals for preventative services and immunizations addressed, dietary and smoking counseling addressed, most recent labs reviewed.  I have personally reviewed and have noted:  1) the patient's medical and social history 2) The pt's use of alcohol, tobacco, and illicit drugs 3) The patient's current medications and supplements 4) Functional ability including ADL's, fall risk, home safety risk, hearing and visual impairment 5) Diet and physical activities 6) Evidence for depression or mood disorder 7) The patient's height, weight, and BMI have been recorded in the chart   PAP smear (first ) done today with considerable difficulty due to narrow vaginal vault I have made referrals, and provided counseling and education based on review of the above       Other Visit Diagnoses    Cervical cancer screening       Relevant Orders   Cytology - PAP( [place])   Chronic vaginitis       Relevant Orders   Cytology - PAP( [place])      I am  having Dexter M. Kent maintain her fluticasone, docusate sodium, cetirizine, Multiple Vitamins-Minerals (MULTIVITAMIN ADULT PO), propranolol, Sprintec 28, and PARoxetine.  No orders of the defined types were placed in this encounter.   There are no discontinued medications.  Follow-up: No follow-ups on file.   Sherlene Shams, MD

## 2019-09-18 NOTE — Patient Instructions (Signed)
Good to see you!  I'll have the results in  A few days    Health Maintenance, Female Adopting a healthy lifestyle and getting preventive care are important in promoting health and wellness. Ask your health care provider about:  The right schedule for you to have regular tests and exams.  Things you can do on your own to prevent diseases and keep yourself healthy. What should I know about diet, weight, and exercise? Eat a healthy diet   Eat a diet that includes plenty of vegetables, fruits, low-fat dairy products, and lean protein.  Do not eat a lot of foods that are high in solid fats, added sugars, or sodium. Maintain a healthy weight Body mass index (BMI) is used to identify weight problems. It estimates body fat based on height and weight. Your health care provider can help determine your BMI and help you achieve or maintain a healthy weight. Get regular exercise Get regular exercise. This is one of the most important things you can do for your health. Most adults should:  Exercise for at least 150 minutes each week. The exercise should increase your heart rate and make you sweat (moderate-intensity exercise).  Do strengthening exercises at least twice a week. This is in addition to the moderate-intensity exercise.  Spend less time sitting. Even light physical activity can be beneficial. Watch cholesterol and blood lipids Have your blood tested for lipids and cholesterol at 22 years of age, then have this test every 5 years. Have your cholesterol levels checked more often if:  Your lipid or cholesterol levels are high.  You are older than 22 years of age.  You are at high risk for heart disease. What should I know about cancer screening? Depending on your health history and family history, you may need to have cancer screening at various ages. This may include screening for:  Breast cancer.  Cervical cancer.  Colorectal cancer.  Skin cancer.  Lung cancer. What  should I know about heart disease, diabetes, and high blood pressure? Blood pressure and heart disease  High blood pressure causes heart disease and increases the risk of stroke. This is more likely to develop in people who have high blood pressure readings, are of African descent, or are overweight.  Have your blood pressure checked: ? Every 3-5 years if you are 12-57 years of age. ? Every year if you are 31 years old or older. Diabetes Have regular diabetes screenings. This checks your fasting blood sugar level. Have the screening done:  Once every three years after age 42 if you are at a normal weight and have a low risk for diabetes.  More often and at a younger age if you are overweight or have a high risk for diabetes. What should I know about preventing infection? Hepatitis B If you have a higher risk for hepatitis B, you should be screened for this virus. Talk with your health care provider to find out if you are at risk for hepatitis B infection. Hepatitis C Testing is recommended for:  Everyone born from [birth year] through 1963.  Anyone with known risk factors for hepatitis C. Sexually transmitted infections (STIs)  Get screened for STIs, including gonorrhea and chlamydia, if: ? You are sexually active and are younger than 22 years of age. ? You are older than 22 years of age and your health care provider tells you that you are at risk for this type of infection. ? Your sexual activity has changed since you were last  screened, and you are at increased risk for chlamydia or gonorrhea. Ask your health care provider if you are at risk.  Ask your health care provider about whether you are at high risk for HIV. Your health care provider may recommend a prescription medicine to help prevent HIV infection. If you choose to take medicine to prevent HIV, you should first get tested for HIV. You should then be tested every 3 months for as long as you are taking the medicine. Pregnancy  If  you are about to stop having your period (premenopausal) and you may become pregnant, seek counseling before you get pregnant.  Take 400 to 800 micrograms (mcg) of folic acid every day if you become pregnant.  Ask for birth control (contraception) if you want to prevent pregnancy. Osteoporosis and menopause Osteoporosis is a disease in which the bones lose minerals and strength with aging. This can result in bone fractures. If you are 43 years old or older, or if you are at risk for osteoporosis and fractures, ask your health care provider if you should:  Be screened for bone loss.  Take a calcium or vitamin D supplement to lower your risk of fractures.  Be given hormone replacement therapy (HRT) to treat symptoms of menopause. Follow these instructions at home: Lifestyle  Do not use any products that contain nicotine or tobacco, such as cigarettes, e-cigarettes, and chewing tobacco. If you need help quitting, ask your health care provider.  Do not use street drugs.  Do not share needles.  Ask your health care provider for help if you need support or information about quitting drugs. Alcohol use  Do not drink alcohol if: ? Your health care provider tells you not to drink. ? You are pregnant, may be pregnant, or are planning to become pregnant.  If you drink alcohol: ? Limit how much you use to 0-1 drink a day. ? Limit intake if you are breastfeeding.  Be aware of how much alcohol is in your drink. In the [place] one drink equals one 12 oz bottle of beer (355 mL), one 5 oz glass of wine (148 mL), or one 1 oz glass of hard liquor (44 mL). General instructions  Schedule regular health, dental, and eye exams.  Stay current with your vaccines.  Tell your health care provider if: ? You often feel depressed. ? You have ever been abused or do not feel safe at home. Summary  Adopting a healthy lifestyle and getting preventive care are important in promoting health and  wellness.  Follow your health care provider's instructions about healthy diet, exercising, and getting tested or screened for diseases.  Follow your health care provider's instructions on monitoring your cholesterol and blood pressure. This information is not intended to replace advice given to you by your health care provider. Make sure you discuss any questions you have with your health care provider. Document Revised: 02/05/2018 Document Reviewed: 02/05/2018 Elsevier Patient Education  2020 [employer].

## 2019-09-20 ENCOUNTER — Telehealth: Payer: Self-pay | Admitting: Internal Medicine

## 2019-09-20 NOTE — Assessment & Plan Note (Signed)
age appropriate education and counseling updated, referrals for preventative services and immunizations addressed, dietary and smoking counseling addressed, most recent labs reviewed.  I have personally reviewed and have noted:  1) the patient's medical and social history 2) The pt's use of alcohol, tobacco, and illicit drugs 3) The patient's current medications and supplements 4) Functional ability including ADL's, fall risk, home safety risk, hearing and visual impairment 5) Diet and physical activities 6) Evidence for depression or mood disorder 7) The patient's height, weight, and BMI have been recorded in the chart   PAP smear (first ) done today with considerable difficulty due to narrow vaginal vault I have made referrals, and provided counseling and education based on review of the above

## 2019-09-20 NOTE — Assessment & Plan Note (Signed)
She remains celibate and uses the ocps to regulate menses.  Liver enzymes ordered

## 2019-09-25 LAB — CYTOLOGY - PAP
Chlamydia: NEGATIVE
Comment: NEGATIVE
Comment: NEGATIVE
Comment: NEGATIVE
Comment: NORMAL
Diagnosis: NEGATIVE
HSV1: NEGATIVE
HSV2: NEGATIVE
Neisseria Gonorrhea: NEGATIVE
Trichomonas: NEGATIVE

## 2019-10-03 DIAGNOSIS — R632 Polyphagia: Secondary | ICD-10-CM | POA: Insufficient documentation

## 2019-11-17 ENCOUNTER — Other Ambulatory Visit: Payer: Self-pay

## 2019-11-17 MED ORDER — PROPRANOLOL HCL 10 MG PO TABS: 5.0000 mg | ORAL_TABLET | Freq: Three times a day (TID) | ORAL | 0 refills | Status: DC

## 2019-12-02 DIAGNOSIS — Z20822 Contact with and (suspected) exposure to covid-19: Secondary | ICD-10-CM | POA: Insufficient documentation

## 2019-12-04 ENCOUNTER — Encounter: Payer: Self-pay | Admitting: Nurse Practitioner

## 2019-12-04 ENCOUNTER — Other Ambulatory Visit: Payer: Self-pay

## 2019-12-04 ENCOUNTER — Encounter: Payer: BC Managed Care – PPO | Admitting: Nurse Practitioner

## 2019-12-06 NOTE — Progress Notes (Signed)
Erroneous- no show for visit.

## 2019-12-08 ENCOUNTER — Ambulatory Visit
Admission: EM | Admit: 2019-12-08 | Discharge: 2019-12-08 | Discharge status: Against Medical Advice | Payer: BC Managed Care – PPO

## 2019-12-08 ENCOUNTER — Other Ambulatory Visit: Payer: Self-pay

## 2019-12-09 ENCOUNTER — Telehealth: Payer: BC Managed Care – PPO | Admitting: Internal Medicine

## 2019-12-09 ENCOUNTER — Encounter: Payer: Self-pay | Admitting: Internal Medicine

## 2019-12-09 VITALS — Temp 98.3°F | Ht 65.0 in | Wt 149.0 lb

## 2019-12-09 DIAGNOSIS — K2970 Gastritis, unspecified, without bleeding: Secondary | ICD-10-CM | POA: Insufficient documentation

## 2019-12-09 DIAGNOSIS — R112 Nausea with vomiting, unspecified: Secondary | ICD-10-CM

## 2019-12-09 DIAGNOSIS — K920 Hematemesis: Secondary | ICD-10-CM | POA: Diagnosis not present

## 2019-12-09 DIAGNOSIS — R1084 Generalized abdominal pain: Secondary | ICD-10-CM | POA: Diagnosis not present

## 2019-12-09 DIAGNOSIS — R109 Unspecified abdominal pain: Secondary | ICD-10-CM | POA: Insufficient documentation

## 2019-12-09 MED ORDER — ONDANSETRON 4 MG PO TBDP: 4.0000 mg | ORAL_TABLET | Freq: Three times a day (TID) | ORAL | 0 refills | Status: DC | PRN

## 2019-12-09 MED ORDER — PANTOPRAZOLE SODIUM 40 MG PO TBEC: 40.0000 mg | DELAYED_RELEASE_TABLET | Freq: Every day | ORAL | 3 refills | Status: DC

## 2019-12-09 NOTE — Assessment & Plan Note (Signed)
Etiology unclear,  Was accompanied by nausea /vomiting.  Will evaluate GB with [place] and follow up with GI

## 2019-12-09 NOTE — Assessment & Plan Note (Signed)
Isolated episode,  Occurred with vomiting,  8 hours after eating a meal that had a chil dipping sauce.  Will presume it was blood.  Start Protonix ,  GI referral requested and made. Patient had normal BUN/lipase/LFTS after the episode occurred via [facility] ED evaluation

## 2019-12-09 NOTE — Progress Notes (Signed)
Virtual Visit via Caregility   This visit type was conducted due to national recommendations for restrictions regarding the COVID-19 pandemic (e.g. social distancing).  This format is felt to be most appropriate for this patient at this time.  All issues noted in this document were discussed and addressed.  No physical exam was performed (except for noted visual exam findings with Video Visits).   I connected with@ on 12/09/19 at 11:30 AM EDT by a video enabled telemedicine applicationand verified that I am speaking with the correct person using two identifiers. Location patient: home Location provider: work or home office Persons participating in the virtual visit: patient, provider  I discussed the limitations, risks, security and privacy concerns of performing an evaluation and management service by telephone and the availability of in person appointments. I also discussed with the patient that there may be a patient responsible charge related to this service. The patient expressed understanding and agreed to proceed.   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  The first episode occurred  Sunday morning at 4 am  .  She woke up with cramping ,  Severe, that resolved spontaneously after one hour .  Symptoms were not accompanied by fever,  Change in stools or vomiting.  2nd episode occurred Monday morning ,  Same time ,  But was accompanied by nausea and vomiting that was streaked with blood. (picture of blood stained tissue examined via Web cam). Patient had eaten eggrolls with chili sauce about 8 hours prior to vomiting. The vomiting resolved after one episode but the nausea lasted several hours.  Went to  [facility] , sent her  to [facility] ER.  Lipase,  UA, CMET UPreg all normal . Told to see PCP for GI referral.  Patient has not had any episodes of vomiting since Monday morning but had diffuse abd tenderness until today.  Does not take NSAIDs.  Took a cold remedy last week for URi ,  COVID negative but it did not contain any NSAIDs.  No fevers,  Hematuria , flank pain.  Does not drink alcohol. Stools have been brown , formed and normal caliber .  No current constipation issues.  Last menses was 10 days prior to occurrence   ROS: See pertinent positives and negatives per HPI.  Past Medical History:  Diagnosis Date  . Allergy     Past Surgical History:  Procedure Laterality Date  . TONSILLECTOMY  2006    Family History  Problem Relation Age of Onset  . Asthma Mother   . Cancer Father        skin cancer  . Cancer Brother   . Arthritis Maternal Grandmother   . Asthma Maternal Grandmother   . Cancer Maternal Grandmother   . Hearing loss Maternal Grandmother   . Hyperlipidemia Maternal Grandmother   . Hypertension Maternal Grandmother   . Cancer Maternal Grandfather   . Diabetes Maternal Grandfather   . Heart disease Maternal Grandfather   . Hyperlipidemia Maternal Grandfather   . Hypertension Maternal Grandfather   . Cancer Paternal Grandmother   . Heart attack Paternal Grandmother   . Kidney disease Paternal Grandmother   . Early death Paternal Grandfather   . Heart  attack Paternal Grandfather     SOCIAL HX:  reports that she has never smoked. She has never used smokeless tobacco. She reports current alcohol use. She reports that she does not use drugs.   Current Outpatient Medications:  .  cetirizine (ZYRTEC) 10 MG tablet, Take 10 mg by mouth 2 (two) times daily. , Disp: , Rfl:  .  docusate sodium (COLACE) 100 MG capsule, Take 100 mg by mouth 2 (two) times daily., Disp: , Rfl:  .  fluticasone (FLONASE) 50 MCG/ACT nasal spray, Place 1 spray into both nostrils daily., Disp: ,  Rfl:  .  Multiple Vitamins-Minerals (MULTIVITAMIN ADULT PO), Take 1 tablet by mouth daily., Disp: , Rfl:  .  PARoxetine (PAXIL) 10 MG tablet, TAKE 1 TABLET BY MOUTH EVERY DAY, Disp: 90 tablet, Rfl: 1 .  propranolol (INDERAL) 10 MG tablet, Take 0.5 tablets (5 mg total) by mouth 3 (three) times daily. As needed for rapid heart rate, Disp: 30 tablet, Rfl: 0 .  SPRINTEC 28 0.25-35 MG-MCG tablet, Take 1 tablet by mouth daily., Disp: 1 Package, Rfl: 11 .  ondansetron (ZOFRAN ODT) 4 MG disintegrating tablet, Take 1 tablet (4 mg total) by mouth every 8 (eight) hours as needed for nausea or vomiting., Disp: 20 tablet, Rfl: 0 .  pantoprazole (PROTONIX) 40 MG tablet, Take 1 tablet (40 mg total) by mouth daily., Disp: 30 tablet, Rfl: 3  EXAM:  VITALS per patient if applicable:  GENERAL: alert, oriented, appears well and in no acute distress  HEENT: atraumatic, conjunttiva clear, no obvious abnormalities on inspection of external nose and ears  NECK: normal movements of the head and neck  LUNGS: on inspection no signs of respiratory distress, breathing rate appears normal, no obvious gross SOB, gasping or wheezing  CV: no obvious cyanosis  MS: moves all visible extremities without noticeable abnormality  PSYCH/NEURO: pleasant and cooperative, no obvious depression or anxiety, speech and thought processing grossly intact  ASSESSMENT AND PLAN:  Discussed the following assessment and plan:  Nausea and vomiting in adult - Plan: US Abdomen Limited RUQ, Ambulatory referral to Gastroenterology  Generalized abdominal pain - Plan: US Abdomen Limited RUQ, Ambulatory referral to Gastroenterology  Hematemesis with nausea  Hematemesis Isolated episode,  Occurred with vomiting,  8 hours after eating a meal that had a chil dipping sauce.  Will presume it was blood.  Start Protonix ,  GI referral requested and made. Patient had normal BUN/lipase/LFTS after the episode occurred via [facility] ED  evaluation   Abdominal pain Etiology unclear,  Was accompanied by nausea /vomiting.  Will evaluate GB with [place] and follow up with GI    I discussed the assessment and treatment plan with the patient. The patient was provided an opportunity to ask questions and all were answered. The patient agreed with the plan and demonstrated an understanding of the instructions.   The patient was advised to call back or seek an in-person evaluation if the symptoms worsen or if the condition fails to improve as anticipated.     Sherlene Shams, MD

## 2019-12-25 ENCOUNTER — Ambulatory Visit: Payer: BC Managed Care – PPO

## 2019-12-31 ENCOUNTER — Ambulatory Visit
Admission: RE | Admit: 2019-12-31 | Discharge: 2019-12-31 | Disposition: A | Discharge status: Home / Self Care | Payer: BC Managed Care – PPO | Source: Ambulatory Visit | Attending: Internal Medicine | Admitting: Internal Medicine

## 2019-12-31 ENCOUNTER — Other Ambulatory Visit: Payer: Self-pay

## 2019-12-31 DIAGNOSIS — R1084 Generalized abdominal pain: Secondary | ICD-10-CM | POA: Insufficient documentation

## 2019-12-31 DIAGNOSIS — R112 Nausea with vomiting, unspecified: Secondary | ICD-10-CM | POA: Diagnosis present

## 2020-01-01 NOTE — Progress Notes (Signed)
Your abdominal ultrasound was normal.  No gallstones were seen. If your episodes of vomiting have continued  please schedule a follow up with me to discuss.

## 2020-01-12 ENCOUNTER — Other Ambulatory Visit: Payer: Self-pay | Admitting: Internal Medicine

## 2020-01-29 MED ORDER — PROPRANOLOL HCL 10 MG PO TABS
5.0000 mg | ORAL_TABLET | Freq: Three times a day (TID) | ORAL | 0 refills | Status: DC
Start: 2020-01-29 — End: 2020-03-24

## 2020-02-23 ENCOUNTER — Other Ambulatory Visit: Payer: Self-pay

## 2020-02-23 ENCOUNTER — Other Ambulatory Visit: Payer: Self-pay | Admitting: Gastroenterology

## 2020-02-23 ENCOUNTER — Encounter: Payer: Self-pay | Admitting: Gastroenterology

## 2020-02-23 ENCOUNTER — Ambulatory Visit (INDEPENDENT_AMBULATORY_CARE_PROVIDER_SITE_OTHER): Payer: BC Managed Care – PPO | Admitting: Gastroenterology

## 2020-02-23 VITALS — BP 122/82 | HR 80 | Temp 98.1°F | Ht 65.0 in | Wt 160.1 lb

## 2020-02-23 DIAGNOSIS — K625 Hemorrhage of anus and rectum: Secondary | ICD-10-CM | POA: Diagnosis not present

## 2020-02-23 DIAGNOSIS — R1013 Epigastric pain: Secondary | ICD-10-CM | POA: Diagnosis not present

## 2020-02-24 MED ORDER — OMEPRAZOLE 20 MG PO CPDR: 20.0000 mg | DELAYED_RELEASE_CAPSULE | Freq: Two times a day (BID) | ORAL | 0 refills | Status: DC

## 2020-02-24 NOTE — Progress Notes (Signed)
Makayla Calderon [address]  [address]  [place], [place] [address]  Main: [phone]  Fax: [phone]   Gastroenterology Consultation  Referring Provider:     Sherlene Shams, MD Primary Care Physician:  Sherlene Shams, MD Reason for Consultation:     Abdominal pain        HPI:    Chief Complaint  Patient presents with  . New Patient (Initial Visit)  . Gastroesophageal Reflux    Having no acid reflex or burning in chest   . Abdominal Pain    Patient has off and on epigastric pain that comes and goes. Last time was last week   . Nausea    Has not had nausea or vomiting in about a month     Makayla Calderon is a 24 y.o. y/o female referred for consultation & management  by Dr. Sherlene Shams, MD.  Patient presents with her mother and reports having intermittent episodes of abdominal pain.  Symptoms are mostly related to meals.  She has to stop eating what she is eating and sometimes cannot leave the house because of the pain.  Went to the ER in October at [facility] and was discharged with conservative management.  Labs were reassuring on that visit.  Recent right upper quadrant ultrasound did not show any findings to explain patient's pain.  Patient reports nausea associated with her pain.  Pain is dull 5/10, nonradiating.  No emesis.  Does report intermittent bright red blood per rectum that started about 6 months ago, and has bright red blood per rectum very intermittently, about once a month.  Past Medical History:  Diagnosis Date  . Allergy     Past Surgical History:  Procedure Laterality Date  . TONSILLECTOMY  2006    Prior to Admission medications   Medication Sig Start Date End Date Taking? Authorizing Provider  cetirizine (ZYRTEC) 10 MG tablet Take 10 mg by mouth 2 (two) times daily.    Yes [provider]  docusate sodium (COLACE) 100 MG capsule Take 100 mg by mouth 2 (two) times daily.   Yes [provider]  fluticasone (FLONASE) 50  MCG/ACT nasal spray Place 1 spray into both nostrils daily.   Yes [provider]  Multiple Vitamins-Minerals (MULTIVITAMIN ADULT PO) Take 1 tablet by mouth daily.   Yes [provider]  ondansetron (ZOFRAN ODT) 4 MG disintegrating tablet Take 1 tablet (4 mg total) by mouth every 8 (eight) hours as needed for nausea or vomiting. 12/09/19  Yes Sherlene Shams, MD  PARoxetine (PAXIL) 10 MG tablet TAKE 1 TABLET BY MOUTH EVERY DAY 01/12/20  Yes Sherlene Shams, MD  propranolol (INDERAL) 10 MG tablet Take 0.5 tablets (5 mg total) by mouth 3 (three) times daily. As needed for rapid heart rate 01/29/20  Yes Sherlene Shams, MD  SPRINTEC 28 0.25-35 MG-MCG tablet Take 1 tablet by mouth daily. 04/17/19  Yes Sherlene Shams, MD    Family History  Problem Relation Age of Onset  . Asthma Mother   . Cancer Father        skin cancer  . Cancer Brother   . Arthritis Maternal Grandmother   . Asthma Maternal Grandmother   . Cancer Maternal Grandmother   . Hearing loss Maternal Grandmother   . Hyperlipidemia Maternal Grandmother   . Hypertension Maternal Grandmother   . Cancer Maternal Grandfather   . Diabetes Maternal Grandfather   . Heart disease Maternal Grandfather   .  Hyperlipidemia Maternal Grandfather   . Hypertension Maternal Grandfather   . Cancer Paternal Grandmother   . Heart attack Paternal Grandmother   . Kidney disease Paternal Grandmother   . Early death Paternal Grandfather   . Heart attack Paternal Grandfather      Social History   Tobacco Use  . Smoking status: Never Smoker  . Smokeless tobacco: Never Used  Substance Use Topics  . Alcohol use: Yes  . Drug use: Never    Allergies as of 02/23/2020  . (No Known Allergies)    Review of Systems:    All systems reviewed and negative except where noted in HPI.   Physical Exam:  BP 122/82 (BP Location: Left Arm, Patient Position: Sitting, Cuff Size: Normal)   Pulse 80   Temp 98.1 F (36.7 C) (Oral)   Ht 5'  5" (1.651 m)   Wt 160 lb 2 oz (72.6 kg)   BMI 26.65 kg/m  No LMP recorded. Psych:  Alert and cooperative. Normal mood and affect. General:   Alert,  Well-developed, well-nourished, pleasant and cooperative in NAD Head:  Normocephalic and atraumatic. Eyes:  Sclera clear, no icterus.   Conjunctiva pink. Ears:  Normal auditory acuity. Nose:  No deformity, discharge, or lesions. Mouth:  No deformity or lesions,oropharynx pink & moist. Neck:  Supple; no masses or thyromegaly. Abdomen:  Normal bowel sounds.  No bruits.  Soft, non-tender and non-distended without masses, hepatosplenomegaly or hernias noted.  No guarding or rebound tenderness.    Msk:  Symmetrical without gross deformities. Good, equal movement & strength bilaterally. Pulses:  Normal pulses noted. Extremities:  No clubbing or edema.  No cyanosis. Neurologic:  Alert and oriented x3;  grossly normal neurologically. Skin:  Intact without significant lesions or rashes. No jaundice. Lymph Nodes:  No significant cervical adenopathy. Psych:  Alert and cooperative. Normal mood and affect.   Labs: CBC    Component Value Date/Time   WBC 6.4 08/23/2017 0000   WBC 5.7 05/30/2013 1150   RBC 4.56 05/30/2013 1150   HGB 14.2 08/23/2017 0000   HGB 12.8 05/30/2013 1150   HCT 43 08/23/2017 0000   HCT 39.1 05/30/2013 1150   PLT 291 08/23/2017 0000   PLT 220 05/30/2013 1150   MCV 86 05/30/2013 1150   MCH 28.0 05/30/2013 1150   MCHC 32.7 05/30/2013 1150   RDW 14.5 05/30/2013 1150   LYMPHSABS 2.2 05/30/2013 1150   MONOABS 0.7 05/30/2013 1150   EOSABS 0.2 05/30/2013 1150   BASOSABS 0.1 05/30/2013 1150   CMP     Component Value Date/Time   NA 138 09/18/2019 1553   NA 142 08/23/2017 0000   NA 141 05/30/2013 1150   K 4.0 09/18/2019 1553   K 3.9 05/30/2013 1150   CL 104 09/18/2019 1553   CL 109 (H) 05/30/2013 1150   CO2 24 09/18/2019 1553   CO2 27 (H) 05/30/2013 1150   GLUCOSE 79 09/18/2019 1553   GLUCOSE 85 05/30/2013 1150    BUN 11 09/18/2019 1553   BUN 9 08/23/2017 0000   BUN 13 05/30/2013 1150   CREATININE 0.62 09/18/2019 1553   CALCIUM 9.8 09/18/2019 1553   CALCIUM 8.9 (L) 05/30/2013 1150   PROT 6.9 09/18/2019 1553   ALBUMIN 4.0 08/26/2018 1525   AST 12 09/18/2019 1553   ALT 8 09/18/2019 1553   ALKPHOS 40 08/26/2018 1525   BILITOT 0.3 09/18/2019 1553    Imaging Studies: No results found.  Assessment and Plan:   Makayla Tindall  Calderon is a 24 y.o. y/o female has been referred for abdominal pain  Patient is already been started on PPI However, PPI has not helped her whatsoever.  We will change to omeprazole twice daily instead of Protonix once daily to see if it improves her symptoms If symptoms do not improve after 2 weeks I have asked her to discontinue the medication and let us know  Obtain H. pylori serology  Due to bright red blood per rectum, obtain fecal calprotectin and if this is elevated, proceed with colonoscopy to rule out IBD  If pain does not improve with PPI, consider EGD as well  (Risks of PPI use were discussed with patient including bone loss, C. Diff diarrhea, pneumonia, infections, CKD, electrolyte abnormalities.  Pt. Verbalizes understanding and chooses to continue the medication.)  I have discussed alternative options, risks & benefits,  which include, but are not limited to, bleeding, infection, perforation,respiratory complication & drug reaction.  The patient agrees with this plan & written consent will be obtained.       Dr Makayla Calderon  Speech recognition software was used to dictate the above note.

## 2020-02-25 ENCOUNTER — Other Ambulatory Visit: Payer: Self-pay | Admitting: Gastroenterology

## 2020-02-25 LAB — H PYLORI, IGM, IGG, IGA AB
H pylori, IgM Abs: <9 units (ref 0.0–8.9)
H. pylori, IgA Abs: <9 units (ref 0.0–8.9)
H. pylori, IgG AbS: 0.14 Index Value (ref 0.00–0.79)

## 2020-03-01 LAB — CALPROTECTIN, FECAL
Calprotectin, Fecal: 22 ug/g (ref 0–120)
Calprotectin, Fecal: 39 ug/g (ref 0–120)

## 2020-03-13 ENCOUNTER — Other Ambulatory Visit: Payer: Self-pay | Admitting: Internal Medicine

## 2020-03-19 ENCOUNTER — Other Ambulatory Visit: Payer: Self-pay | Admitting: Gastroenterology

## 2020-03-24 ENCOUNTER — Other Ambulatory Visit: Payer: Self-pay

## 2020-03-24 MED ORDER — PROPRANOLOL HCL 10 MG PO TABS: 5.0000 mg | ORAL_TABLET | Freq: Three times a day (TID) | ORAL | 0 refills | Status: DC

## 2020-04-06 ENCOUNTER — Telehealth (INDEPENDENT_AMBULATORY_CARE_PROVIDER_SITE_OTHER): Payer: Self-pay | Admitting: Gastroenterology

## 2020-04-06 DIAGNOSIS — K30 Functional dyspepsia: Secondary | ICD-10-CM

## 2020-04-06 DIAGNOSIS — K625 Hemorrhage of anus and rectum: Secondary | ICD-10-CM

## 2020-04-06 MED ORDER — OMEPRAZOLE 40 MG PO CPDR: 40.0000 mg | DELAYED_RELEASE_CAPSULE | Freq: Every day | ORAL | 0 refills | Status: DC

## 2020-04-06 NOTE — Patient Instructions (Addendum)
Please start taking Omeprazole 40 MG twice a day with no refills.  Please start taking Mialax 17 G daily.

## 2020-04-07 NOTE — Progress Notes (Signed)
Makayla Bouillon, MD [address]  [address]  [place], [place] [address]  Main: [phone]  Fax: [phone]   Primary Care Physician: Sherlene Shams, MD  Virtual Visit via Video Note  I connected with patient on 04/07/20 at  3:15 PM EST by video (using [email or web address]) and verified that I am speaking with the correct person using two identifiers.   I discussed the limitations, risks, security and privacy concerns of performing an evaluation and management service by video and the availability of in person appointments. I also discussed with the patient that there may be a patient responsible charge related to this service. The patient expressed understanding and agreed to proceed.  Location of Patient: Home Location of Provider: Home Persons involved: Patient and provider only (Nursing staff checked in patient via phone but were not physically involved in the video interaction - see their notes)   History of Present Illness: Chief complaint: Abdominal pain  HPI: Makayla Calderon is a 25 y.o. female here for follow-up of abdominal pain.  Patient states her symptoms are much better to completely improved on omeprazole twice daily.  Denies any dysphagia, nausea or vomiting.  However, is reporting worsening of her indigestion symptoms and feels a burning sensation in her throat and chest intermittently despite taking the medications twice daily.  She is also reporting minute amount of blood in tissue paper about every other day.  Fecal calprotectin was normal after last visit  H. pylori serology was negative  Previous history: Patient presents with her mother and reports having intermittent episodes of abdominal pain.  Symptoms are mostly related to meals.  She has to stop eating what she is eating and sometimes cannot leave the house because of the pain.  Went to the ER in October at [facility] and was discharged with conservative management.  Labs were reassuring on that visit.  Recent  right upper quadrant ultrasound did not show any findings to explain patient's pain.  Patient reports nausea associated with her pain.  Pain is dull 5/10, nonradiating.  No emesis.  Does report intermittent bright red blood per rectum that started about 6 months ago, and has bright red blood per rectum very intermittently, about once a month.  Current Outpatient Medications  Medication Sig Dispense Refill  . omeprazole (PRILOSEC) 40 MG capsule Take 1 capsule (40 mg total) by mouth daily. 30 capsule 0  . cetirizine (ZYRTEC) 10 MG tablet Take 10 mg by mouth 2 (two) times daily.     Marland Kitchen docusate sodium (COLACE) 100 MG capsule Take 100 mg by mouth 2 (two) times daily.    . fluticasone (FLONASE) 50 MCG/ACT nasal spray Place 1 spray into both nostrils daily.    . Multiple Vitamins-Minerals (MULTIVITAMIN ADULT PO) Take 1 tablet by mouth daily.    . norgestimate-ethinyl estradiol (ORTHO-CYCLEN) 0.25-35 MG-MCG tablet TAKE 1 TABLET BY MOUTH EVERY DAY 84 tablet 3  . ondansetron (ZOFRAN ODT) 4 MG disintegrating tablet Take 1 tablet (4 mg total) by mouth every 8 (eight) hours as needed for nausea or vomiting. 20 tablet 0  . PARoxetine (PAXIL) 10 MG tablet TAKE 1 TABLET BY MOUTH EVERY DAY 90 tablet 1  . propranolol (INDERAL) 10 MG tablet Take 0.5 tablets (5 mg total) by mouth 3 (three) times daily. As needed for rapid heart rate 30 tablet 0   No current facility-administered medications for this visit.    Allergies as of 04/06/2020  . (No Known Allergies)    Review  of Systems:    All systems reviewed and negative except where noted in HPI.   Observations/Objective:  Labs: CMP     Component Value Date/Time   NA 138 09/18/2019 1553   NA 142 08/23/2017 0000   NA 141 05/30/2013 1150   K 4.0 09/18/2019 1553   K 3.9 05/30/2013 1150   CL 104 09/18/2019 1553   CL 109 (H) 05/30/2013 1150   CO2 24 09/18/2019 1553   CO2 27 (H) 05/30/2013 1150   GLUCOSE 79 09/18/2019 1553   GLUCOSE 85 05/30/2013 1150    BUN 11 09/18/2019 1553   BUN 9 08/23/2017 0000   BUN 13 05/30/2013 1150   CREATININE 0.62 09/18/2019 1553   CALCIUM 9.8 09/18/2019 1553   CALCIUM 8.9 (L) 05/30/2013 1150   PROT 6.9 09/18/2019 1553   ALBUMIN 4.0 08/26/2018 1525   AST 12 09/18/2019 1553   ALT 8 09/18/2019 1553   ALKPHOS 40 08/26/2018 1525   BILITOT 0.3 09/18/2019 1553   Lab Results  Component Value Date   WBC 6.4 08/23/2017   HGB 14.2 08/23/2017   HCT 43 08/23/2017   MCV 86 05/30/2013   PLT 291 08/23/2017    Imaging Studies: No results found.  Assessment and Plan:   Makayla Calderon is a 25 y.o. y/o female here for follow-up of abdominal pain  Assessment and Plan: Abdominal pain is completely resolved However, patient is reporting indigestion and heartburn daily despite PPI twice daily  No alarm symptoms present such as dysphagia, weight loss  Proper use of omeprazole 30 to 45 minutes before for breakfast and dinner discussed and patient verbalized understanding  However, due to ongoing symptoms we will increase omeprazole to 40 mg twice daily for 4 weeks and then patient was advised to discontinue the medication  If symptoms continue despite this, can add H2 RA at bedtime  Patient educated extensively on acid reflux lifestyle modification, including buying a bed wedge, not eating 3 hrs before bedtime, diet modifications, and handout given for the same.   (Risks of PPI use were discussed with patient including bone loss, C. Diff diarrhea, pneumonia, infections, CKD, electrolyte abnormalities.  Pt. Verbalizes understanding and chooses to continue the medication.)  I have asked her to start taking MiraLAX daily over-the-counter.  And if her bright red blood per rectum still continues, we may need to proceed with colonoscopy and possible upper endoscopy as well if upper GI symptoms continue as well  Follow Up Instructions:    I discussed the assessment and treatment plan with the patient. The patient was  provided an opportunity to ask questions and all were answered. The patient agreed with the plan and demonstrated an understanding of the instructions.   The patient was advised to call back or seek an in-person evaluation if the symptoms worsen or if the condition fails to improve as anticipated.  I provided 16 minutes of face-to-face time via video software during this encounter. Additional time was spent in reviewing patient's chart, placing orders etc.   Pasty Spillers, MD  Speech recognition software was used to dictate this note.

## 2020-04-12 ENCOUNTER — Other Ambulatory Visit: Payer: Self-pay | Admitting: Internal Medicine

## 2020-04-22 ENCOUNTER — Other Ambulatory Visit: Payer: Self-pay

## 2020-04-22 MED ORDER — OMEPRAZOLE 40 MG PO CPDR: 40.0000 mg | DELAYED_RELEASE_CAPSULE | Freq: Two times a day (BID) | ORAL | 0 refills | Status: DC

## 2020-05-17 ENCOUNTER — Other Ambulatory Visit: Payer: Self-pay | Admitting: Gastroenterology

## 2020-05-31 ENCOUNTER — Other Ambulatory Visit: Payer: Self-pay

## 2020-05-31 ENCOUNTER — Telehealth (INDEPENDENT_AMBULATORY_CARE_PROVIDER_SITE_OTHER): Payer: BC Managed Care – PPO | Admitting: Gastroenterology

## 2020-05-31 ENCOUNTER — Encounter: Payer: Self-pay | Admitting: Gastroenterology

## 2020-05-31 DIAGNOSIS — K5900 Constipation, unspecified: Secondary | ICD-10-CM | POA: Diagnosis not present

## 2020-05-31 DIAGNOSIS — R109 Unspecified abdominal pain: Secondary | ICD-10-CM | POA: Diagnosis not present

## 2020-05-31 MED ORDER — OMEPRAZOLE 40 MG PO CPDR: 40.0000 mg | DELAYED_RELEASE_CAPSULE | Freq: Every day | ORAL | 0 refills | Status: DC

## 2020-06-01 NOTE — Progress Notes (Signed)
[name], MD [address]  [address]  [place], [place] [address]  Main: [phone]  Fax: [phone]   Primary Care Physician: Sherlene Shams, MD  Virtual Visit via Video Note  I connected with patient on 06/01/20 at  1:45 PM EDT by video and verified that I am speaking with the correct person using two identifiers.   I discussed the limitations, risks, security and privacy concerns of performing an evaluation and management service by video and the availability of in person appointments. I also discussed with the patient that there may be a patient responsible charge related to this service. The patient expressed understanding and agreed to proceed.  Location of Patient: Home Location of Provider: Home Persons involved: Patient and provider only (Nursing staff checked in patient via phone but were not physically involved in the video interaction - see their notes)   History of Present Illness: Chief Complaint  Patient presents with  . Gastroesophageal Reflux    HPI: Makayla Calderon is a 25 y.o. female here for follow-up of abdominal pain.  Patient was placed on PPI twice daily on last visit and she states her abdominal symptoms have completely resolved.  Denies any abdominal pain, nausea or vomiting.  Denies any dysphagia.  No weight loss.  No bright red blood per rectum.  Has been taking MiraLAX daily and having 1 soft bowel movement daily.  Current Outpatient Medications  Medication Sig Dispense Refill  . cetirizine (ZYRTEC) 10 MG tablet Take 10 mg by mouth 2 (two) times daily.     Marland Kitchen docusate sodium (COLACE) 100 MG capsule Take 100 mg by mouth 2 (two) times daily.    . fluticasone (FLONASE) 50 MCG/ACT nasal spray Place 1 spray into both nostrils daily.    . Multiple Vitamins-Minerals (MULTIVITAMIN ADULT PO) Take 1 tablet by mouth daily.    . norgestimate-ethinyl estradiol (ORTHO-CYCLEN) 0.25-35 MG-MCG tablet TAKE 1 TABLET BY MOUTH EVERY DAY 84 tablet 3  .  omeprazole (PRILOSEC) 40 MG capsule Take 1 capsule (40 mg total) by mouth daily. 30 capsule 0  . PARoxetine (PAXIL) 10 MG tablet TAKE 1 TABLET BY MOUTH EVERY DAY 90 tablet 1  . propranolol (INDERAL) 10 MG tablet TAKE 0.5 TABLETS (5 MG TOTAL) BY MOUTH 3 (THREE) TIMES DAILY. AS NEEDED FOR RAPID HEART RATE 30 tablet 0   No current facility-administered medications for this visit.    Allergies as of 05/31/2020  . (No Known Allergies)    Review of Systems:    All systems reviewed and negative except where noted in HPI.   Observations/Objective:  Labs: CMP     Component Value Date/Time   NA 138 09/18/2019 1553   NA 142 08/23/2017 0000   NA 141 05/30/2013 1150   K 4.0 09/18/2019 1553   K 3.9 05/30/2013 1150   CL 104 09/18/2019 1553   CL 109 (H) 05/30/2013 1150   CO2 24 09/18/2019 1553   CO2 27 (H) 05/30/2013 1150   GLUCOSE 79 09/18/2019 1553   GLUCOSE 85 05/30/2013 1150   BUN 11 09/18/2019 1553   BUN 9 08/23/2017 0000   BUN 13 05/30/2013 1150   CREATININE 0.62 09/18/2019 1553   CALCIUM 9.8 09/18/2019 1553   CALCIUM 8.9 (L) 05/30/2013 1150   PROT 6.9 09/18/2019 1553   ALBUMIN 4.0 08/26/2018 1525   AST 12 09/18/2019 1553   ALT 8 09/18/2019 1553   ALKPHOS 40 08/26/2018 1525   BILITOT 0.3 09/18/2019 1553  Lab Results  Component Value Date   WBC 6.4 08/23/2017   HGB 14.2 08/23/2017   HCT 43 08/23/2017   MCV 86 05/30/2013   PLT 291 08/23/2017    Imaging Studies: No results found.  Assessment and Plan:   Makayla Calderon is a 25 y.o. y/o female here for follow-up of abdominal pain  Assessment and Plan: Abdominal pain has completely resolved at this time I have advised her to decrease omeprazole to once daily and after a month if no further symptoms, to discontinue the medication  She has identified foods that cause her symptoms and I have advised her to minimize these fluids  Patient educated extensively on acid reflux lifestyle modification, including buying a bed  wedge, not eating 3 hrs before bedtime, diet modifications, and handout given for the same.   If symptoms recur, patient to call us and she verbalized understanding  MiraLAX is helping her and she has not had any further bright red blood per rectum.  Again, if it reoccurs patient advised to call us and consider colonoscopy at that time  High-fiber diet encouraged  Follow Up Instructions: Follow-up in 3 months   I discussed the assessment and treatment plan with the patient. The patient was provided an opportunity to ask questions and all were answered. The patient agreed with the plan and demonstrated an understanding of the instructions.   The patient was advised to call back or seek an in-person evaluation if the symptoms worsen or if the condition fails to improve as anticipated.  I provided 15 minutes of face-to-face time via video software during this encounter. Additional time was spent in reviewing patient's chart, placing orders etc.   Pasty Spillers, MD  Speech recognition software was used to dictate this note.

## 2020-06-07 ENCOUNTER — Other Ambulatory Visit: Payer: Self-pay

## 2020-06-07 MED ORDER — PROPRANOLOL HCL 10 MG PO TABS: 5.0000 mg | ORAL_TABLET | Freq: Three times a day (TID) | ORAL | 0 refills | Status: DC

## 2020-06-24 ENCOUNTER — Other Ambulatory Visit: Payer: Self-pay | Admitting: Gastroenterology

## 2020-07-01 ENCOUNTER — Other Ambulatory Visit: Payer: Self-pay

## 2020-07-07 ENCOUNTER — Other Ambulatory Visit: Payer: Self-pay

## 2020-07-07 MED ORDER — OMEPRAZOLE 40 MG PO CPDR: 40.0000 mg | DELAYED_RELEASE_CAPSULE | Freq: Every day | ORAL | 0 refills | Status: DC

## 2020-07-20 ENCOUNTER — Other Ambulatory Visit: Payer: Self-pay | Admitting: Internal Medicine

## 2020-07-29 ENCOUNTER — Other Ambulatory Visit: Payer: Self-pay | Admitting: Gastroenterology

## 2020-08-02 ENCOUNTER — Other Ambulatory Visit: Payer: Self-pay

## 2020-08-03 MED ORDER — OMEPRAZOLE 40 MG PO CPDR
40.0000 mg | DELAYED_RELEASE_CAPSULE | Freq: Every day | ORAL | 0 refills | Status: DC
Start: 2020-08-03 — End: 2020-08-31

## 2020-08-17 ENCOUNTER — Telehealth: Payer: Self-pay

## 2020-08-17 DIAGNOSIS — Z111 Encounter for screening for respiratory tuberculosis: Secondary | ICD-10-CM

## 2020-08-17 NOTE — Telephone Encounter (Signed)
LMTCB

## 2020-08-17 NOTE — Telephone Encounter (Signed)
Orders placed for Quantiferon for future labs.

## 2020-08-22 ENCOUNTER — Other Ambulatory Visit (INDEPENDENT_AMBULATORY_CARE_PROVIDER_SITE_OTHER): Payer: BC Managed Care – PPO

## 2020-08-22 ENCOUNTER — Other Ambulatory Visit: Payer: Self-pay

## 2020-08-22 DIAGNOSIS — Z111 Encounter for screening for respiratory tuberculosis: Secondary | ICD-10-CM | POA: Diagnosis not present

## 2020-08-24 LAB — QUANTIFERON-TB GOLD PLUS
Mitogen-NIL: >10 IU/mL
NIL: 0.02 IU/mL
QuantiFERON-TB Gold Plus: NEGATIVE
TB1-NIL: 0 IU/mL
TB2-NIL: 0 IU/mL

## 2020-08-30 ENCOUNTER — Other Ambulatory Visit: Payer: Self-pay | Admitting: Gastroenterology

## 2020-08-31 ENCOUNTER — Telehealth (INDEPENDENT_AMBULATORY_CARE_PROVIDER_SITE_OTHER): Payer: BC Managed Care – PPO | Admitting: Gastroenterology

## 2020-08-31 ENCOUNTER — Encounter: Payer: Self-pay | Admitting: Gastroenterology

## 2020-08-31 DIAGNOSIS — K5900 Constipation, unspecified: Secondary | ICD-10-CM | POA: Diagnosis not present

## 2020-08-31 DIAGNOSIS — K625 Hemorrhage of anus and rectum: Secondary | ICD-10-CM

## 2020-08-31 DIAGNOSIS — R1013 Epigastric pain: Secondary | ICD-10-CM | POA: Diagnosis not present

## 2020-08-31 MED ORDER — OMEPRAZOLE 20 MG PO CPDR: 20.0000 mg | DELAYED_RELEASE_CAPSULE | Freq: Every day | ORAL | 0 refills | Status: DC

## 2020-08-31 NOTE — Progress Notes (Signed)
[name], MD [address]  [address]  [place], [place] [address]  Main: [phone]  Fax: [phone]   Primary Care Physician: Sherlene Shams, MD  Virtual Visit via Video Note  I connected with patient on 08/31/20 at  2:15 PM EDT by video and verified that I am speaking with the correct person using two identifiers.   I discussed the limitations, risks, security and privacy concerns of performing an evaluation and management service by video and the availability of in person appointments. I also discussed with the patient that there may be a patient responsible charge related to this service. The patient expressed understanding and agreed to proceed.  Location of Patient: Home Location of Provider: Home Persons involved: Patient and provider only (Nursing staff checked in patient via phone but were not physically involved in the video interaction - see their notes)   History of Present Illness: Chief Complaint  Patient presents with   Follow-up    3 month--stable on Omeprazole    HPI: Makayla Calderon is a 25 y.o. female here for follow-up of reflux, abdominal pain and constipation.  Her symptoms have completely resolved with Prilosec 40 mg once a day, with no further abdominal pain, heartburn.  Denies any dysphagia, nausea or vomiting.  Reports good appetite.  No weight loss.  Is taking MiraLAX once a day and is reporting 1-2 soft bowel movements a day with no straining.  However, is still seeing bright red blood per rectum about twice a week, on tissue paper only.  Current Outpatient Medications  Medication Sig Dispense Refill   cetirizine (ZYRTEC) 10 MG tablet Take 10 mg by mouth 2 (two) times daily.      docusate sodium (COLACE) 100 MG capsule Take 100 mg by mouth 2 (two) times daily.     fluticasone (FLONASE) 50 MCG/ACT nasal spray Place 1 spray into both nostrils daily.     Multiple Vitamins-Minerals (MULTIVITAMIN ADULT PO) Take 1 tablet by mouth daily.      norgestimate-ethinyl estradiol (ORTHO-CYCLEN) 0.25-35 MG-MCG tablet TAKE 1 TABLET BY MOUTH EVERY DAY 84 tablet 3   omeprazole (PRILOSEC) 20 MG capsule Take 1 capsule (20 mg total) by mouth daily. 30 capsule 0   PARoxetine (PAXIL) 10 MG tablet TAKE 1 TABLET BY MOUTH EVERY DAY 90 tablet 1   propranolol (INDERAL) 10 MG tablet Take 0.5 tablets (5 mg total) by mouth 3 (three) times daily. As needed for rapid heart rate 30 tablet 0   No current facility-administered medications for this visit.    Allergies as of 08/31/2020   (No Known Allergies)    Review of Systems:    All systems reviewed and negative except where noted in HPI.   Observations/Objective:  Labs: CMP     Component Value Date/Time   NA 138 09/18/2019 1553   NA 142 08/23/2017 0000   NA 141 05/30/2013 1150   K 4.0 09/18/2019 1553   K 3.9 05/30/2013 1150   CL 104 09/18/2019 1553   CL 109 (H) 05/30/2013 1150   CO2 24 09/18/2019 1553   CO2 27 (H) 05/30/2013 1150   GLUCOSE 79 09/18/2019 1553   GLUCOSE 85 05/30/2013 1150   BUN 11 09/18/2019 1553   BUN 9 08/23/2017 0000   BUN 13 05/30/2013 1150   CREATININE 0.62 09/18/2019 1553   CALCIUM 9.8 09/18/2019 1553   CALCIUM 8.9 (L) 05/30/2013 1150   PROT 6.9 09/18/2019 1553   ALBUMIN 4.0 08/26/2018 1525  AST 12 09/18/2019 1553   ALT 8 09/18/2019 1553   ALKPHOS 40 08/26/2018 1525   BILITOT 0.3 09/18/2019 1553   Lab Results  Component Value Date   WBC 6.4 08/23/2017   HGB 14.2 08/23/2017   HCT 43 08/23/2017   MCV 86 05/30/2013   PLT 291 08/23/2017    Imaging Studies: No results found.  Assessment and Plan:   Makayla Calderon is a 25 y.o. y/o female here for follow-up of abdominal pain, and constipation, and rectal blood per rectum  Assessment and Plan: Given her abdominal pain and reflux symptoms have resolved, I have advised her to decrease omeprazole to 20 mg once a day.  New prescription sent to pharmacy.  If her symptoms remain well controlled on this, I  have advised her to discontinue the medication altogether in 2 weeks and she verbalized understanding of these instructions.  If symptoms return, patient advised to let us know and she verbalized understanding.  (Risks of PPI use were discussed with patient including bone loss, C. Diff diarrhea, pneumonia, infections, CKD, electrolyte abnormalities.  Pt. Verbalizes understanding and chooses to continue the medication.)  The more concerning symptom is her continued bright red blood per rectum despite resolution of constipation and no further straining with her bowel movements  Given the above, I will obtain labs to ensure she is not anemic, that inflammatory markers are normal and fecal calprotectin is normal   If symptoms continue, we may need to consider colonoscopy for her continued bright red blood per rectum  Follow Up Instructions:    I discussed the assessment and treatment plan with the patient. The patient was provided an opportunity to ask questions and all were answered. The patient agreed with the plan and demonstrated an understanding of the instructions.   The patient was advised to call back or seek an in-person evaluation if the symptoms worsen or if the condition fails to improve as anticipated.  I provided 15 minutes of face-to-face time via video software during this encounter. Additional time was spent in reviewing patient's chart, placing orders etc.   Pasty Spillers, MD  Speech recognition software was used to dictate this note.

## 2020-09-25 ENCOUNTER — Other Ambulatory Visit: Payer: Self-pay | Admitting: Gastroenterology

## 2020-09-28 ENCOUNTER — Other Ambulatory Visit: Payer: Self-pay

## 2020-10-06 ENCOUNTER — Other Ambulatory Visit: Payer: Self-pay

## 2020-10-06 ENCOUNTER — Ambulatory Visit (INDEPENDENT_AMBULATORY_CARE_PROVIDER_SITE_OTHER): Payer: BC Managed Care – PPO | Admitting: Internal Medicine

## 2020-10-06 ENCOUNTER — Encounter: Payer: Self-pay | Admitting: Internal Medicine

## 2020-10-06 VITALS — BP 116/70 | HR 85 | Temp 96.6°F | Ht 65.0 in | Wt 165.4 lb

## 2020-10-06 DIAGNOSIS — R635 Abnormal weight gain: Secondary | ICD-10-CM

## 2020-10-06 DIAGNOSIS — K589 Irritable bowel syndrome without diarrhea: Secondary | ICD-10-CM

## 2020-10-06 DIAGNOSIS — F4010 Social phobia, unspecified: Secondary | ICD-10-CM

## 2020-10-06 DIAGNOSIS — R519 Headache, unspecified: Secondary | ICD-10-CM | POA: Diagnosis not present

## 2020-10-06 DIAGNOSIS — R5383 Other fatigue: Secondary | ICD-10-CM

## 2020-10-06 DIAGNOSIS — K2950 Unspecified chronic gastritis without bleeding: Secondary | ICD-10-CM

## 2020-10-06 DIAGNOSIS — F320 Major depressive disorder, single episode, mild: Secondary | ICD-10-CM

## 2020-10-06 LAB — CBC WITH DIFFERENTIAL/PLATELET
Basophils Absolute: 0.1 10*3/uL (ref 0.0–0.1)
Basophils Relative: 1.1 % (ref 0.0–3.0)
Eosinophils Absolute: 0 10*3/uL (ref 0.0–0.7)
Eosinophils Relative: 0.7 % (ref 0.0–5.0)
HCT: 37.4 % (ref 36.0–46.0)
Hemoglobin: 11.9 g/dL — ABNORMAL LOW (ref 12.0–15.0)
Lymphocytes Relative: 38.2 % (ref 12.0–46.0)
Lymphs Abs: 2.4 10*3/uL (ref 0.7–4.0)
MCHC: 31.8 g/dL (ref 30.0–36.0)
MCV: 78 fl (ref 78.0–100.0)
Monocytes Absolute: 0.4 10*3/uL (ref 0.1–1.0)
Monocytes Relative: 6.1 % (ref 3.0–12.0)
Neutro Abs: 3.4 10*3/uL (ref 1.4–7.7)
Neutrophils Relative %: 53.9 % (ref 43.0–77.0)
Platelets: 332 10*3/uL (ref 150.0–400.0)
RBC: 4.79 Mil/uL (ref 3.87–5.11)
RDW: 14.9 % (ref 11.5–15.5)
WBC: 6.3 10*3/uL (ref 4.0–10.5)

## 2020-10-06 LAB — COMPREHENSIVE METABOLIC PANEL
ALT: 9 U/L (ref 0–35)
AST: 13 U/L (ref 0–37)
Albumin: 4.3 g/dL (ref 3.5–5.2)
Alkaline Phosphatase: 67 U/L (ref 39–117)
BUN: 10 mg/dL (ref 6–23)
CO2: 23 mEq/L (ref 19–32)
Calcium: 9.8 mg/dL (ref 8.4–10.5)
Chloride: 104 mEq/L (ref 96–112)
Creatinine, Ser: 0.7 mg/dL (ref 0.40–1.20)
GFR: 121.83 mL/min (ref >60.00)
Glucose, Bld: 87 mg/dL (ref 70–99)
Potassium: 4.2 mEq/L (ref 3.5–5.1)
Sodium: 139 mEq/L (ref 135–145)
Total Bilirubin: 0.4 mg/dL (ref 0.2–1.2)
Total Protein: 7.3 g/dL (ref 6.0–8.3)

## 2020-10-06 LAB — TSH: TSH: 2.56 u[IU]/mL (ref 0.35–5.50)

## 2020-10-06 LAB — LIPID PANEL
Cholesterol: 212 mg/dL — ABNORMAL HIGH (ref 0–200)
HDL: 56.3 mg/dL (ref >39.00)
NonHDL: 155.27
Total CHOL/HDL Ratio: 4
Triglycerides: 223 mg/dL — ABNORMAL HIGH (ref 0.0–149.0)
VLDL: 44.6 mg/dL — ABNORMAL HIGH (ref 0.0–40.0)

## 2020-10-06 LAB — C-REACTIVE PROTEIN: CRP: 1.8 mg/dL (ref 0.5–20.0)

## 2020-10-06 LAB — LDL CHOLESTEROL, DIRECT: Direct LDL: 136 mg/dL

## 2020-10-06 LAB — SEDIMENTATION RATE: Sed Rate: 40 mm/hr — ABNORMAL HIGH (ref 0–20)

## 2020-10-06 MED ORDER — OMEPRAZOLE 40 MG PO CPDR: 40.0000 mg | DELAYED_RELEASE_CAPSULE | Freq: Every day | ORAL | 2 refills | Status: DC

## 2020-10-06 MED ORDER — BUPROPION HCL ER (XL) 150 MG PO TB24: 150.0000 mg | ORAL_TABLET | Freq: Every day | ORAL | 1 refills | Status: DC

## 2020-10-06 NOTE — Patient Instructions (Signed)
FOR YOUR DEPRESSION:  We are weaning you off of paxil and starting wellbutrin xl 150 mg once daily in the morning with breakfast.  Continue paxil daily for 7 days,  then take paxil every other day for one week (3 more doses)  then stop   If you feel better on wellbutrin alone,  continue just wellbutrin If you feel more irritable but more energy,  resume the paxil and take both    Resume omeprazole (prilosec)  at 40 mg daily and schedule follow up with dr Maximino Greenland   Weight gain:  time management, meal planning,  EXERCISE   FOLLOW UP IN 3-4 WEEKS FOR HEADACHE MANAGEMENT

## 2020-10-06 NOTE — Progress Notes (Signed)
Subjective:  Patient ID: Makayla Calderon, female    DOB: 11-23-1995  Age: 25 y.o. MRN: [id number]  CC: The primary encounter diagnosis was Irritable bowel syndrome, unspecified type. Diagnoses of Fatigue, unspecified type, Weight gain, Headache, chronic daily, Current mild episode of major depressive disorder without prior episode (HCC), Chronic gastritis without bleeding, unspecified gastritis type, and Social anxiety disorder were also pertinent to this visit.  HPI [name] presents for preventive exam but had multiple acute issues     1) daily headaches occurring both morning and evening .  Waking up with one  daily for the past several  weeks with no preceeding nighttime headache.   Denies nausea.  Takes tylenol alternating with ibuprofen, both resolve transiently,  no vision changes no neck and shoulder pain .  Last eye exam was over a year ago and feels she is having eye strain.  Headache is always behind the eyes ,  with no history  of temple tenderness, sinus congestion or ear pain.  No double vision but does think that her visual acuity has changed compared to last year. Marland Kitchen  2) Weight gain in the past year.working full time  and going to nursing school , no time for regular exercise.   Eating out a lot more , mostly at fast food venues.    3)  Feels more depressed this year.  Brother died in May 13, 2019 year from [place]  HAD several months of GRIEF COUNSELLING THROUGH [employer],  stopped in June when school started.  Feels more tired during the day,  struggles to stay awake during the day,  struggles to wake up in the morning . Does not snore per family. Use of Paxil   has resolved her anxiety.  Still taking it,  no longer taking propranolol.    3) IBS:  GI symptoms  have returned with reduced omeprazole dose,  still having BRBPR   Outpatient Medications Prior to Visit  Medication Sig Dispense Refill   cetirizine (ZYRTEC) 10 MG tablet Take 10 mg by mouth 2 (two) times daily.      docusate  sodium (COLACE) 100 MG capsule Take 100 mg by mouth 2 (two) times daily.     fluticasone (FLONASE) 50 MCG/ACT nasal spray Place 1 spray into both nostrils daily.     Multiple Vitamins-Minerals (MULTIVITAMIN ADULT PO) Take 1 tablet by mouth daily.     norgestimate-ethinyl estradiol (ORTHO-CYCLEN) 0.25-35 MG-MCG tablet TAKE 1 TABLET BY MOUTH EVERY DAY 84 tablet 3   PARoxetine (PAXIL) 10 MG tablet TAKE 1 TABLET BY MOUTH EVERY DAY 90 tablet 1   omeprazole (PRILOSEC) 20 MG capsule TAKE 1 CAPSULE BY MOUTH EVERY DAY 30 capsule 0   propranolol (INDERAL) 10 MG tablet Take 0.5 tablets (5 mg total) by mouth 3 (three) times daily. As needed for rapid heart rate 30 tablet 0   No facility-administered medications prior to visit.    Review of Systems;  Patient denies , fevers, malaise, unintentional weight loss, skin rash, eye pain, sinus congestion and sinus pain, sore throat, dysphagia,  hemoptysis , cough, dyspnea, wheezing, chest pain, palpitations, orthopnea, edema, abdominal pain, nausea, melena, diarrhea, constipation, flank pain, dysuria, hematuria, urinary  Frequency, nocturia, numbness, tingling, seizures,  Focal weakness, Loss of consciousness,  Tremor, insomnia,  anxiety, and suicidal ideation.      Objective:  BP 116/70 (BP Location: Left Arm, Patient Position: Sitting, Cuff Size: Normal)   Pulse 85   Temp (!) 96.6 F (35.9  C) (Temporal)   Ht 5\' 5"  (1.651 m)   Wt 165 lb 6.4 oz (75 kg)   SpO2 95%   BMI 27.52 kg/m   BP Readings from Last 3 Encounters:  10/06/20 116/70  02/23/20 122/82  09/18/19 120/70    Wt Readings from Last 3 Encounters:  10/06/20 165 lb 6.4 oz (75 kg)  02/23/20 160 lb 2 oz (72.6 kg)  12/09/19 149 lb (67.6 kg)    General appearance: alert, cooperative and appears stated age Ears: normal TM's and external ear canals both ears Throat: lips, mucosa, and tongue normal; teeth and gums normal Neck: no adenopathy, no carotid bruit, supple, symmetrical, trachea  midline and thyroid not enlarged, symmetric, no tenderness/mass/nodules Back: symmetric, no curvature. ROM normal. No CVA tenderness. Lungs: clear to auscultation bilaterally Heart: regular rate and rhythm, S1, S2 normal, no murmur, click, rub or gallop Abdomen: soft, non-tender; bowel sounds normal; no masses,  no organomegaly Pulses: 2+ and symmetric Skin: Skin color, texture, turgor normal. No rashes or lesions Lymph nodes: Cervical, supraclavicular, and axillary nodes normal. Psych: affect normal, makes good eye contact. No fidgeting,  Smiles easily.  Denies suicidal thoughts      12/11/19 Abdomen Limited RUQ  Result Date: 12/31/2019 CLINICAL DATA:  Abdominal pain, nausea and vomiting EXAM: ULTRASOUND ABDOMEN LIMITED RIGHT UPPER QUADRANT COMPARISON:  CT abdomen pelvis 12/29/2005 FINDINGS: Gallbladder: No gallstones or wall thickening visualized. No sonographic Murphy sign noted by sonographer. Common bile duct: Diameter: 0.3 cm, within normal limits. Liver: No focal lesion identified. Within normal limits in parenchymal echogenicity. Portal vein is patent on color Doppler imaging with normal direction of blood flow towards the liver. Other: None. IMPRESSION: No sonographic finding to explain the patient's abdominal pain. Electronically Signed   By: 13/04/2005 M.D.   On: 12/31/2019 16:19    Assessment & Plan:   Problem List Items Addressed This Visit       Unprioritized   Social anxiety disorder    Resolved with use of paxil.  Weaning to off given new onset depression (secondary to complicated grief) and fatigue       Relevant Medications   buPROPion (WELLBUTRIN XL) 150 MG 24 hr tablet   Irritable bowel syndrome (IBS) - Primary    Reviewed prior workup which included CT abd pelvis,  RUQ ultrasound and noninvasive GI consult.  Symptoms previously resolved with omeprazole 40 mg daily but  have returned with reducing omeprazole to 20 mg daily and she continues to have BRBPR .  She will  need a diagnostic colonscopy; advised to return to Dr 13/05/2019 and increase the omeprazole back to 40 mg daily.  rx sent .        Relevant Medications   omeprazole (PRILOSEC) 40 MG capsule   Other Relevant Orders   Sedimentation rate (Completed)   C-reactive protein (Completed)   Major depressive disorder with current active episode    Complicated by excessive fatigue, which may be post COVID  Or paxil side effect.  Does not snore per family .  Trial of wellbutrin , weaning paxil over 2 weeks       Relevant Medications   buPROPion (WELLBUTRIN XL) 150 MG 24 hr tablet   Gastritis    previously resolved with use of omeprazole 40 mg daily but now recurrent  Either due to use of Motrin or due to lower dose of omeprazole. Resume higher dose.  Return to Dr Maximino Greenland for EGD consideration       Headache,  chronic daily    New onset,  Recurring both in the morning and evening,  Described as located  behind the eyes.  Exam is normal; vasculitis unlikely given  normal CRP,   and she has no temple tenderness on exam, no nuchal rigidity .  Will resume propranolol at follow up in one month if still present after treating depression and fatigue       Relevant Medications   buPROPion (WELLBUTRIN XL) 150 MG 24 hr tablet   Weight gain    Secondary to life out of balance due to work and school schedule.  Not eating healthy and not exercising.  I have addressed  BMI and recommended a low glycemic index diet utilizing smaller more frequent meals to increase metabolism.  I have also recommended that patient start exercising with a goal of 30 minutes of aerobic exercise a minimum of 5 days per week. Screening for lipid disorders, thyroid and diabetes have been  done today.        Relevant Orders   Lipid panel (Completed)    Meds ordered this encounter  Medications   omeprazole (PRILOSEC) 40 MG capsule    Sig: Take 1 capsule (40 mg total) by mouth daily.    Dispense:  30 capsule    Refill:  2    buPROPion (WELLBUTRIN XL) 150 MG 24 hr tablet    Sig: Take 1 tablet (150 mg total) by mouth daily.    Dispense:  30 tablet    Refill:  1    I provided  30 minutes  Was spent  reviewing patient's current problems and recent encounters with her GI specialists, reviewing and ordering  labs and imaging studies, providing counseling on the above mentioned problems , and evaluating patient  In a face to face visit  .  Medications Discontinued During This Encounter  Medication Reason   omeprazole (PRILOSEC) 20 MG capsule    propranolol (INDERAL) 10 MG tablet     Follow-up: Return in about 4 weeks (around 11/03/2020).   Sherlene Shams, MD

## 2020-10-08 DIAGNOSIS — R519 Headache, unspecified: Secondary | ICD-10-CM | POA: Insufficient documentation

## 2020-10-08 DIAGNOSIS — R635 Abnormal weight gain: Secondary | ICD-10-CM | POA: Insufficient documentation

## 2020-10-08 NOTE — Assessment & Plan Note (Signed)
Complicated by excessive fatigue, which may be post COVID  Or paxil side effect.  Does not snore per family .  Trial of wellbutrin , weaning paxil over 2 weeks

## 2020-10-08 NOTE — Assessment & Plan Note (Signed)
Resolved with use of paxil.  Weaning to off given new onset depression (secondary to complicated grief) and fatigue

## 2020-10-08 NOTE — Assessment & Plan Note (Addendum)
Reviewed prior workup which included CT abd pelvis,  RUQ ultrasound and noninvasive GI consult.  Symptoms previously resolved with omeprazole 40 mg daily but  have returned with reducing omeprazole to 20 mg daily and she continues to have BRBPR .  She will need a diagnostic colonscopy; advised to return to Dr Maximino Greenland and increase the omeprazole back to 40 mg daily.  rx sent .

## 2020-10-08 NOTE — Assessment & Plan Note (Addendum)
New onset,  Recurring both in the morning and evening,  Described as located  behind the eyes.  Exam is normal; vasculitis unlikely given  normal CRP,   and she has no temple tenderness on exam, no nuchal rigidity .  Will resume propranolol at follow up in one month if still present after treating depression and fatigue

## 2020-10-08 NOTE — Assessment & Plan Note (Signed)
Secondary to life out of balance due to work and school schedule.  Not eating healthy and not exercising.  I have addressed  BMI and recommended a low glycemic index diet utilizing smaller more frequent meals to increase metabolism.  I have also recommended that patient start exercising with a goal of 30 minutes of aerobic exercise a minimum of 5 days per week. Screening for lipid disorders, thyroid and diabetes have been  done today.

## 2020-10-08 NOTE — Assessment & Plan Note (Signed)
previously resolved with use of omeprazole 40 mg daily but now recurrent  Either due to use of Motrin or due to lower dose of omeprazole. Resume higher dose.  Return to Dr Maximino Greenland for EGD consideration

## 2020-10-22 ENCOUNTER — Other Ambulatory Visit: Payer: Self-pay | Admitting: Gastroenterology

## 2020-10-29 ENCOUNTER — Other Ambulatory Visit: Payer: Self-pay | Admitting: Internal Medicine

## 2020-10-29 DIAGNOSIS — F320 Major depressive disorder, single episode, mild: Secondary | ICD-10-CM

## 2020-11-07 NOTE — Telephone Encounter (Signed)
Patient started the taper off Paxil on 10/10/20 and started the Wellbutrin . ON 8/29 was patient first day with just Wellbutrin then "On August 29th, I stopped taking th Paxil and continued with just the Wellbutrin. On September the 5th, I began to have a lot of headaches. They come in the morning, evening and night and are really bad. I also began to have consistent dizziness that will not go away. That started on Sunday or Monday and it feels like I'm consistently dizzy/lightheaded and "fuzzy feeling" on the inside; kind of like i'm going to pass out" Jittery on inside cannot calm self down. Patient has not taken the Wellbutrin today and would like to know how to ween off Wellbutrin.

## 2020-11-07 NOTE — Telephone Encounter (Signed)
Patient is calling in to check on the status of the message below stating she is having symptoms and would like to know if she can be tapered off the medicine.Please advise.

## 2020-11-07 NOTE — Telephone Encounter (Signed)
Patient's mother called back office. She would like Shanda Bumps to call her at [phone]

## 2020-11-07 NOTE — Telephone Encounter (Signed)
Spoke with Makayla Calderon and with her mother.  Advised to stop welbutrin immediately.  No taper needed.     Do not resume Paxil either.  Follow up with virtual visit in 2 weeks to determine if alternative med is needed. . Can use a Tuesday late morning or Thursday early afternoon

## 2020-11-15 NOTE — Telephone Encounter (Signed)
Patient is calling to check on the status of the message below,please advise.

## 2020-11-16 ENCOUNTER — Encounter: Payer: BC Managed Care – PPO | Admitting: Internal Medicine

## 2020-11-29 ENCOUNTER — Telehealth: Payer: BC Managed Care – PPO | Admitting: Internal Medicine

## 2020-11-29 ENCOUNTER — Encounter: Payer: Self-pay | Admitting: Internal Medicine

## 2020-11-29 DIAGNOSIS — U071 COVID-19: Secondary | ICD-10-CM | POA: Diagnosis not present

## 2020-11-29 DIAGNOSIS — F320 Major depressive disorder, single episode, mild: Secondary | ICD-10-CM | POA: Diagnosis not present

## 2020-11-29 MED ORDER — CHERATUSSIN AC 100-10 MG/5ML PO SOLN: 5.0000 mL | Freq: Three times a day (TID) | ORAL | 0 refills | Status: DC | PRN

## 2020-11-29 MED ORDER — MOLNUPIRAVIR EUA 200MG CAPSULE: 4.0000 | ORAL_CAPSULE | Freq: Two times a day (BID) | ORAL | 0 refills | Status: AC

## 2020-11-29 MED ORDER — SERTRALINE HCL 50 MG PO TABS: 50.0000 mg | ORAL_TABLET | Freq: Every day | ORAL | 3 refills | Status: DC

## 2020-11-29 MED ORDER — ONDANSETRON 4 MG PO TBDP: 4.0000 mg | ORAL_TABLET | Freq: Three times a day (TID) | ORAL | 0 refills | Status: DC | PRN

## 2020-11-29 NOTE — Assessment & Plan Note (Signed)
molnupiravir ordered to reduce symptom duration.  cheratussin for cough suppression

## 2020-11-29 NOTE — Progress Notes (Signed)
Virtual Visit via CaregilityNote  This visit type was conducted due to national recommendations for restrictions regarding the COVID-19 pandemic (e.g. social distancing).  This format is felt to be most appropriate for this patient at this time.  All issues noted in this document were discussed and addressed.  No physical exam was performed (except for noted visual exam findings with Video Visits).   I connected withNAME@ on 11/29/20 at  2:00 PM EDT by a video enabled telemedicine application  and verified that I am speaking with the correct person using two identifiers. Location patient: home Location provider: work or home office Persons participating in the virtual visit: patient, provider  I discussed the limitations, risks, security and privacy concerns of performing an evaluation and management service by telephone and the availability of in person appointments. I also discussed with the patient that there may be a patient responsible charge related to this service. The patient expressed understanding and agreed to proceed.  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  Denies fever and dyspnea .  Taking otc tylenol and nsaids, and cold meds   2) Major depressive disorder:  she stopped wellbutrin  several weeks ago due to side effects of dizziness, frequent headaches,  and increased appetite (constantly hungry)    ROS: See pertinent positives and negatives per HPI.  Past Medical History:  Diagnosis Date   Allergy     Past Surgical History:  Procedure Laterality Date   TONSILLECTOMY  2006    Family History  Problem Relation Age of Onset   Asthma Mother    Cancer Father        skin cancer    Cancer Brother    Arthritis Maternal Grandmother    Asthma Maternal Grandmother    Cancer Maternal Grandmother    Hearing loss Maternal Grandmother    Hyperlipidemia Maternal Grandmother    Hypertension Maternal Grandmother    Cancer Maternal Grandfather    Diabetes Maternal Grandfather    Heart disease Maternal Grandfather    Hyperlipidemia Maternal Grandfather    Hypertension Maternal Grandfather    Cancer Paternal Grandmother    Heart attack Paternal Grandmother    Kidney disease Paternal Grandmother    Early death Paternal Grandfather    Heart attack Paternal Grandfather     SOCIAL HX:  reports that she has never smoked. She has never used smokeless tobacco. She reports current alcohol use. She reports that she does not use drugs.   Current Outpatient Medications:    cetirizine (ZYRTEC) 10 MG tablet, Take 10 mg by mouth 2 (two) times daily. , Disp: , Rfl:    docusate sodium (COLACE) 100 MG capsule, Take 100 mg by mouth 2 (two) times daily., Disp: , Rfl:    fluticasone (FLONASE) 50 MCG/ACT nasal spray, Place 1 spray into both nostrils daily., Disp: , Rfl:    guaiFENesin-codeine (CHERATUSSIN AC) 100-10 MG/5ML syrup, Take 5 mLs by mouth 3 (three) times daily as needed for cough., Disp: 120 mL, Rfl: 0   molnupiravir EUA (LAGEVRIO) 200 mg CAPS capsule, Take 4 capsules (800 mg total) by mouth 2 (two) times daily for 5 days., Disp: 40 capsule, Rfl: 0   Multiple Vitamins-Minerals (  MULTIVITAMIN ADULT PO), Take 1 tablet by mouth daily., Disp: , Rfl:    norgestimate-ethinyl estradiol (ORTHO-CYCLEN) 0.25-35 MG-MCG tablet, TAKE 1 TABLET BY MOUTH EVERY DAY, Disp: 84 tablet, Rfl: 3   ondansetron (ZOFRAN ODT) 4 MG disintegrating tablet, Take 1 tablet (4 mg total) by mouth every 8 (eight) hours as needed for nausea or vomiting., Disp: 20 tablet, Rfl: 0   propranolol (INDERAL) 10 MG tablet, Take 10 mg by mouth 3 (three) times daily. As needed, Disp: , Rfl:    sertraline (ZOLOFT) 50 MG tablet, Take 1  tablet (50 mg total) by mouth daily., Disp: 30 tablet, Rfl: 3  EXAM:  VITALS per patient if applicable:  GENERAL: alert, oriented, appears well and in no acute distress  HEENT: atraumatic, conjunttiva clear, no obvious abnormalities on inspection of external nose and ears  NECK: normal movements of the head and neck  LUNGS: on inspection no signs of respiratory distress, breathing rate appears normal, no obvious gross SOB, gasping or wheezing  CV: no obvious cyanosis  MS: moves all visible extremities without noticeable abnormality  PSYCH/NEURO: pleasant and cooperative, no obvious depression or anxiety, speech and thought processing grossly intact  ASSESSMENT AND PLAN:  Discussed the following assessment and plan:  Current mild episode of major depressive disorder without prior episode (HCC)  Acute COVID-19  Major depressive disorder with current active episode She did not tolerate wellbutrin due to increased stimulant effects (dizziness,  Increased appetite and headaches).  She would like an alternative . Discussed alternative therapy with sertraline once current illness is over.   Acute COVID-19 molnupiravir ordered to reduce symptom duration.  cheratussin for cough suppression     I discussed the assessment and treatment plan with the patient. The patient was provided an opportunity to ask questions and all were answered. The patient agreed with the plan and demonstrated an understanding of the instructions.   The patient was advised to call back or seek an in-person evaluation if the symptoms worsen or if the condition fails to improve as anticipated.   I spent 30 minutes dedicated to the care of this patient on the date of this encounter to include pre-visit review of her medical history,  Face-to-face time with the patient , and post visit ordering of  therapeutics.    Sherlene Shams, MD

## 2020-11-29 NOTE — Patient Instructions (Signed)
Take 4 ibuprofen 1 tylenol and one cold & sinus every 8 hours as needed for the body aches , sore throat,  congestion and drainage   The molnupiravir should be started ASAP , this is your antiviral,  finish the entire 5 day regimen  (4 tablets twice daily for 5 days)  Cheratussin (cough syrup with codeine) for nighttime cough; this is sent in along with Zofran (odansetron) for nausea which you dissolve in mouth    Once you are feeling better start the  Sertraline (generic for Zoloft) at 1/2 tablet daily in the morning with breakfast for the first week to avoid nausea.  You can increase to a full tablet after 1 week if you havenot developed side effects of nausea.    You should start to feel a difference after two weeks on the full dose   Follow up one month

## 2020-11-29 NOTE — Assessment & Plan Note (Signed)
She did not tolerate wellbutrin due to increased stimulant effects (dizziness,  Increased appetite and headaches).  She would like an alternative . Discussed alternative therapy with sertraline once current illness is over.

## 2020-12-21 ENCOUNTER — Other Ambulatory Visit: Payer: Self-pay | Admitting: Internal Medicine

## 2020-12-30 ENCOUNTER — Other Ambulatory Visit: Payer: Self-pay

## 2020-12-30 ENCOUNTER — Encounter: Payer: Self-pay | Admitting: Internal Medicine

## 2020-12-30 ENCOUNTER — Ambulatory Visit (INDEPENDENT_AMBULATORY_CARE_PROVIDER_SITE_OTHER): Payer: BC Managed Care – PPO | Admitting: Internal Medicine

## 2020-12-30 VITALS — BP 126/88 | HR 113 | Temp 96.8°F | Ht 65.0 in | Wt 168.4 lb

## 2020-12-30 DIAGNOSIS — K625 Hemorrhage of anus and rectum: Secondary | ICD-10-CM

## 2020-12-30 DIAGNOSIS — D649 Anemia, unspecified: Secondary | ICD-10-CM | POA: Diagnosis not present

## 2020-12-30 DIAGNOSIS — K2950 Unspecified chronic gastritis without bleeding: Secondary | ICD-10-CM

## 2020-12-30 DIAGNOSIS — Z0000 Encounter for general adult medical examination without abnormal findings: Secondary | ICD-10-CM

## 2020-12-30 DIAGNOSIS — U071 COVID-19: Secondary | ICD-10-CM

## 2020-12-30 MED ORDER — OMEPRAZOLE 40 MG PO CPDR: 40.0000 mg | DELAYED_RELEASE_CAPSULE | Freq: Every day | ORAL | 3 refills | Status: DC

## 2020-12-30 NOTE — Assessment & Plan Note (Signed)

## 2020-12-30 NOTE — Assessment & Plan Note (Signed)
Symptoms have returned after stopping omeprazole.  Refilling today

## 2020-12-30 NOTE — Patient Instructions (Addendum)
Resume omeprazole 40 mg daily he Follow up with Dr Maximino Greenland

## 2020-12-30 NOTE — Assessment & Plan Note (Signed)
Occurring 2-3 times weekly despite resolving constipation with use of miralax.   Labs ordered,  Follow up with Dr.  Maximino Greenland for colonoscopy

## 2020-12-30 NOTE — Progress Notes (Signed)
Patient ID: Makayla Calderon, female    DOB: 08-10-1995  Age: 25 y.o. MRN: [id number]  The patient is here for annual preventive  examination and management of other chronic and acute problems.   The risk factors are reflected in the social history.  The roster of all physicians providing medical care to patient - is listed in the Snapshot section of the chart.  Activities of daily living:  The patient is 100% independent in all ADLs: dressing, toileting, feeding as well as independent mobility  Home safety : The patient has smoke detectors in the home. They wear seatbelts.  There are no firearms at home. There is no violence in the home.   There is no risks for hepatitis, STDs or HIV. There is no   history of blood transfusion. They have no travel history to infectious disease endemic areas of the world.  The patient has seen their dentist in the last six month. They have seen their eye doctor in the last year. They admit to slight hearing difficulty with regard to whispered voices and some television programs.  They have deferred audiologic testing in the last year.  They do not  have excessive sun exposure. Discussed the need for sun protection: hats, long sleeves and use of sunscreen if there is significant sun exposure.   Diet: the importance of a healthy diet is discussed. They do have a healthy diet.  The benefits of regular aerobic exercise were discussed. She walks 4 times per week ,  20 minutes.   Depression screen: there are no signs or vegative symptoms of depression- irritability, change in appetite, anhedonia, sadness/tearfullness.  Cognitive assessment: the patient manages all their financial and personal affairs and is actively engaged. They could relate day,date,year and events; recalled 2/3 objects at 3 minutes; performed clock-face test normally.  The following portions of the patient's history were reviewed and updated as appropriate: allergies, current medications, past family  history, past medical history,  past surgical history, past social history  and problem list.  Visual acuity was not assessed per patient preference since she has regular follow up with her ophthalmologist. Hearing and body mass index were assessed and reviewed.   During the course of the visit the patient was educated and counseled about appropriate screening and preventive services including : fall prevention , diabetes screening, nutrition counseling, colorectal cancer screening, and recommended immunizations.    CC: The primary encounter diagnosis was Anemia, unspecified type. Diagnoses of Acute COVID-19, Encounter for preventive health examination, Chronic gastritis without bleeding, unspecified gastritis type, and BRBPR (bright red blood per rectum) were also pertinent to this visit.  1) recovered from Makayla Calderon in early October .  Some mucous   2) recurrent BRBPR   . Still occurring 2-3 times per week despite improvement in constipation with use of metamucil .   Has not had follow up with GI since July  visit ( saw Tahiliani  who rec labs and colonoscopy if persistent)  3) Gastritis:  symptoms returned after stopping omeprazole .    [name] has a past medical history of Allergy.   She has a past surgical history that includes Tonsillectomy (2006).   Her family history includes Arthritis in her maternal grandmother; Asthma in her maternal grandmother and mother; Cancer in her brother, father, maternal grandfather, maternal grandmother, and paternal grandmother; Diabetes in her maternal grandfather; Early death in her paternal grandfather; Hearing loss in her maternal grandmother; Heart attack in her paternal grandfather and paternal  grandmother; Heart disease in her maternal grandfather; Hyperlipidemia in her maternal grandfather and maternal grandmother; Hypertension in her maternal grandfather and maternal grandmother; Kidney disease in her paternal grandmother.She reports that  she has never smoked. She has never used smokeless tobacco. She reports current alcohol use. She reports that she does not use drugs.  Outpatient Medications Prior to Visit  Medication Sig Dispense Refill   cetirizine (ZYRTEC) 10 MG tablet Take 10 mg by mouth 2 (two) times daily.      docusate sodium (COLACE) 100 MG capsule Take 100 mg by mouth 2 (two) times daily.     fluticasone (FLONASE) 50 MCG/ACT nasal spray Place 1 spray into both nostrils daily.     Multiple Vitamins-Minerals (MULTIVITAMIN ADULT PO) Take 1 tablet by mouth daily.     norgestimate-ethinyl estradiol (ORTHO-CYCLEN) 0.25-35 MG-MCG tablet TAKE 1 TABLET BY MOUTH EVERY DAY 84 tablet 3   propranolol (INDERAL) 10 MG tablet Take 10 mg by mouth 3 (three) times daily. As needed     sertraline (ZOLOFT) 50 MG tablet TAKE 1 TABLET BY MOUTH EVERY DAY 90 tablet 2   guaiFENesin-codeine (CHERATUSSIN AC) 100-10 MG/5ML syrup Take 5 mLs by mouth 3 (three) times daily as needed for cough. (Patient not taking: Reported on 12/30/2020) 120 mL 0   ondansetron (ZOFRAN ODT) 4 MG disintegrating tablet Take 1 tablet (4 mg total) by mouth every 8 (eight) hours as needed for nausea or vomiting. (Patient not taking: Reported on 12/30/2020) 20 tablet 0   No facility-administered medications prior to visit.    Review of Systems  Patient denies headache, fevers, malaise, unintentional weight loss, skin rash, eye pain, sinus congestion and sinus pain, sore throat, dysphagia,  hemoptysis , cough, dyspnea, wheezing, chest pain, palpitations, orthopnea, edema,  nausea, melena, diarrhea, constipation, flank pain, dysuria, hematuria, urinary  Frequency, nocturia, numbness, tingling, seizures,  Focal weakness, Loss of consciousness,  Tremor, insomnia, depression, anxiety, and suicidal ideation.     Objective:  BP 126/88 (BP Location: Left Arm, Patient Position: Sitting, Cuff Size: Normal)   Pulse (!) 113   Temp (!) 96.8 F (36 C) (Temporal)   Ht 5\' 5"  (1.651 m)    Wt 168 lb 6.4 oz (76.4 kg)   SpO2 96%   BMI 28.02 kg/m   Physical Exam .  General appearance: alert, cooperative and appears stated age Head: Normocephalic, without obvious abnormality, atraumatic Eyes: conjunctivae/corneas clear. PERRL, EOM's intact. Fundi benign. Ears: normal TM's and external ear canals both ears Nose: Nares normal. Septum midline. Mucosa normal. No drainage or sinus tenderness. Throat: lips, mucosa, and tongue normal; teeth and gums normal Neck: no adenopathy, no carotid bruit, no JVD, supple, symmetrical, trachea midline and thyroid not enlarged, symmetric, no tenderness/mass/nodules Lungs: clear to auscultation bilaterally Breasts: normal appearance, no masses or tenderness Heart: regular rate and rhythm, S1, S2 normal, no murmur, click, rub or gallop Abdomen: soft, non-tender; bowel sounds normal; no masses,  no organomegaly Extremities: extremities normal, atraumatic, no cyanosis or edema Pulses: 2+ and symmetric Skin: Skin color, texture, turgor normal. No rashes or lesions Neurologic: Alert and oriented X 3, normal strength and tone. Normal symmetric reflexes. Normal coordination and gait.     Assessment & Plan:   Problem List Items Addressed This Visit     Acute COVID-19    recovered fully      BRBPR (bright red blood per rectum)    Occurring 2-3 times weekly despite resolving constipation with use of miralax.   Labs ordered,  Follow up with Dr.  Bonna Gains for colonoscopy      Encounter for preventive health examination    age appropriate education and counseling updated, referrals for preventative services and immunizations addressed, dietary and smoking counseling addressed, most recent labs reviewed.  I have personally reviewed and have noted:   1) the patient's medical and social history 2) The pt's use of alcohol, tobacco, and illicit drugs 3) The patient's current medications and supplements 4) Functional ability including ADL's, fall  risk, home safety risk, hearing and visual impairment 5) Diet and physical activities 6) Evidence for depression or mood disorder 7) The patient's height, weight, and BMI have been recorded in the chart   I have made referrals, and provided counseling and education based on review of the above      Gastritis    Symptoms have returned after stopping omeprazole.  Refilling today       Other Visit Diagnoses     Anemia, unspecified type    -  Primary   Relevant Orders   Calprotectin, Fecal   Sedimentation rate   C-reactive protein   CBC with Differential/Platelet   Iron, TIBC and Ferritin Panel       I have discontinued Dare M. Corkern's Cheratussin AC and ondansetron. I am also having her start on omeprazole. Additionally, I am having her maintain her fluticasone, docusate sodium, cetirizine, Multiple Vitamins-Minerals (MULTIVITAMIN ADULT PO), norgestimate-ethinyl estradiol, propranolol, and sertraline.  Meds ordered this encounter  Medications   omeprazole (PRILOSEC) 40 MG capsule    Sig: Take 1 capsule (40 mg total) by mouth daily.    Dispense:  30 capsule    Refill:  3    Medications Discontinued During This Encounter  Medication Reason   guaiFENesin-codeine (CHERATUSSIN AC) 100-10 MG/5ML syrup Completed Course   ondansetron (ZOFRAN ODT) 4 MG disintegrating tablet Completed Course    Follow-up: No follow-ups on file.   Crecencio Mc, MD

## 2020-12-30 NOTE — Assessment & Plan Note (Signed)
recovered fully

## 2020-12-30 NOTE — Addendum Note (Signed)
Addended by: Warden Fillers on: 12/30/2020 02:57 PM   Modules accepted: Orders

## 2020-12-31 LAB — CBC WITH DIFFERENTIAL/PLATELET
Absolute Monocytes: 509 cells/uL (ref 200–950)
Basophils Absolute: 53 cells/uL (ref 0–200)
Basophils Relative: 0.7 %
Eosinophils Absolute: 38 cells/uL (ref 15–500)
Eosinophils Relative: 0.5 %
HCT: 36.1 % (ref 35.0–45.0)
Hemoglobin: 11.6 g/dL — ABNORMAL LOW (ref 11.7–15.5)
Lymphs Abs: 3070 cells/uL (ref 850–3900)
MCH: 25.1 pg — ABNORMAL LOW (ref 27.0–33.0)
MCHC: 32.1 g/dL (ref 32.0–36.0)
MCV: 78.1 fL — ABNORMAL LOW (ref 80.0–100.0)
MPV: 10.4 fL (ref 7.5–12.5)
Monocytes Relative: 6.7 %
Neutro Abs: 3929 cells/uL (ref 1500–7800)
Neutrophils Relative %: 51.7 %
Platelets: 320 10*3/uL (ref 140–400)
RBC: 4.62 10*6/uL (ref 3.80–5.10)
RDW: 16.5 % — ABNORMAL HIGH (ref 11.0–15.0)
Total Lymphocyte: 40.4 %
WBC: 7.6 10*3/uL (ref 3.8–10.8)

## 2020-12-31 LAB — IRON,TIBC AND FERRITIN PANEL
%SAT: 6 % (calc) — ABNORMAL LOW (ref 16–45)
Ferritin: 3 ng/mL — ABNORMAL LOW (ref 16–154)
Iron: 33 ug/dL — ABNORMAL LOW (ref 40–190)
TIBC: 520 mcg/dL (calc) — ABNORMAL HIGH (ref 250–450)

## 2020-12-31 LAB — C-REACTIVE PROTEIN: CRP: 25.1 mg/L — ABNORMAL HIGH (ref <8.0)

## 2020-12-31 LAB — SEDIMENTATION RATE: Sed Rate: 25 mm/h — ABNORMAL HIGH (ref 0–20)

## 2021-01-03 ENCOUNTER — Telehealth: Payer: Self-pay

## 2021-01-03 DIAGNOSIS — D509 Iron deficiency anemia, unspecified: Secondary | ICD-10-CM

## 2021-01-03 NOTE — Telephone Encounter (Signed)
-----   Message from Pasty Spillers, MD sent at 01/02/2021  9:39 AM EST ----- Thank you for forwarding these to me. Definitely abnormal.  Tymia Streb, please call this pt and advise her that I recommend EGD and colonoscopy for iron deficiency anemia and BRBPR.  Please also refer to hematology for IDA if she is agreeable.   ----- Message ----- From: Sherlene Shams, MD Sent: 12/31/2020  10:54 PM EST To: Pasty Spillers, MD  Forwarding abnormal labs to you ; this is the young lady with recurrent hematochezia.  The labs aren't complete yet but all are abnormal  ----- Message ----- From: Interface, [facility] Lab Results In Sent: 12/30/2020  10:31 PM EDT To: Sherlene Shams, MD

## 2021-01-03 NOTE — Telephone Encounter (Signed)
Left detailed msg on VM per HIPAA  

## 2021-01-04 NOTE — Telephone Encounter (Signed)
Left message on voicemail.

## 2021-01-05 NOTE — Telephone Encounter (Signed)
Left detailed msg on VM per HIPAA  

## 2021-01-06 NOTE — Addendum Note (Signed)
Addended by: Hulan Fray on: 01/06/2021 05:10 PM   Modules accepted: Orders

## 2021-01-09 ENCOUNTER — Other Ambulatory Visit: Payer: Self-pay

## 2021-01-09 DIAGNOSIS — K625 Hemorrhage of anus and rectum: Secondary | ICD-10-CM

## 2021-01-09 DIAGNOSIS — D509 Iron deficiency anemia, unspecified: Secondary | ICD-10-CM

## 2021-01-09 LAB — CALPROTECTIN, FECAL: Calprotectin, Fecal: 34 ug/g (ref 0–120)

## 2021-01-09 NOTE — Addendum Note (Signed)
Addended by: Roena Malady on: 01/09/2021 10:53 AM   Modules accepted: Orders

## 2021-01-09 NOTE — Telephone Encounter (Signed)
Procedures scheduled for [place] 01/30/2021 Sample of Clenpiq given and instructions sent via mychart and copy left with sample

## 2021-01-18 ENCOUNTER — Encounter: Payer: Self-pay | Admitting: Gastroenterology

## 2021-01-20 ENCOUNTER — Other Ambulatory Visit: Payer: Self-pay | Admitting: Internal Medicine

## 2021-01-22 NOTE — Telephone Encounter (Signed)
The original prescription was discontinued on 11/07/2020 by Sherlene Shams, MD. Renewing this prescription may not be appropriate.

## 2021-01-23 NOTE — Telephone Encounter (Signed)
Denied. Patient is taking sertraline per last visit .  Should not be taking both and should not be changing without discussing with me.

## 2021-01-24 NOTE — Telephone Encounter (Signed)
Spoke with pt and she stated that she is only taking the Sertraline

## 2021-01-27 ENCOUNTER — Inpatient Hospital Stay: Payer: BC Managed Care – PPO

## 2021-01-27 ENCOUNTER — Other Ambulatory Visit: Payer: Self-pay

## 2021-01-27 ENCOUNTER — Encounter: Payer: Self-pay | Admitting: Oncology

## 2021-01-27 ENCOUNTER — Inpatient Hospital Stay: Payer: BC Managed Care – PPO | Attending: Oncology | Admitting: Oncology

## 2021-01-27 VITALS — BP 123/83 | HR 102 | Temp 98.6°F | Resp 18 | Wt 167.5 lb

## 2021-01-27 DIAGNOSIS — R109 Unspecified abdominal pain: Secondary | ICD-10-CM | POA: Diagnosis not present

## 2021-01-27 DIAGNOSIS — D500 Iron deficiency anemia secondary to blood loss (chronic): Secondary | ICD-10-CM

## 2021-01-27 DIAGNOSIS — Z79899 Other long term (current) drug therapy: Secondary | ICD-10-CM | POA: Insufficient documentation

## 2021-01-27 DIAGNOSIS — Z809 Family history of malignant neoplasm, unspecified: Secondary | ICD-10-CM | POA: Diagnosis not present

## 2021-01-27 DIAGNOSIS — K921 Melena: Secondary | ICD-10-CM | POA: Diagnosis not present

## 2021-01-27 DIAGNOSIS — K5900 Constipation, unspecified: Secondary | ICD-10-CM | POA: Diagnosis not present

## 2021-01-27 DIAGNOSIS — D509 Iron deficiency anemia, unspecified: Secondary | ICD-10-CM | POA: Insufficient documentation

## 2021-01-27 DIAGNOSIS — R5383 Other fatigue: Secondary | ICD-10-CM | POA: Insufficient documentation

## 2021-01-27 HISTORY — DX: Iron deficiency anemia secondary to blood loss (chronic): D50.0

## 2021-01-27 NOTE — Progress Notes (Signed)
Pt here to establish care for IDA.  

## 2021-01-27 NOTE — Progress Notes (Signed)
Hematology/Oncology Consult note [phone] [phone]      Patient Care Team: Crecencio Mc, MD as PCP - General (Internal Medicine)  REFERRING PROVIDER: Virgel Manifold, MD  CHIEF COMPLAINTS/REASON FOR VISIT:  Evaluation of iron deficiency anemia  HISTORY OF PRESENTING ILLNESS:   Makayla Calderon is a  25 y.o.  female with PMH listed below was seen in consultation at the request of  Vonda Antigua B, MD  for evaluation of iron deficiency anemia Patient was accompanied by her mother. Patient has had abdominal pain/constipation, reflux.  She has noticed blood in her stool recently  she has been seen by gastroenterology and will have colonoscopy work-up next week. 12/30/2020, CBC showed hemoglobin 11.6, MCV 78.1.  Iron saturation 6, ferritin 3. Elevated CRP, ESR 25. She reports that menstrual period flow is not heavy. Patient reports feeling fatigued and tired. She denies any history of autoimmune disease.   Review of Systems  Constitutional:  Positive for fatigue. Negative for appetite change, chills and fever.  HENT:   Negative for hearing loss and voice change.   Eyes:  Negative for eye problems.  Respiratory:  Negative for chest tightness and cough.   Cardiovascular:  Negative for chest pain.  Gastrointestinal:  Negative for abdominal distention, abdominal pain and blood in stool.  Endocrine: Negative for hot flashes.  Genitourinary:  Negative for difficulty urinating and frequency.   Musculoskeletal:  Negative for arthralgias.  Skin:  Negative for itching and rash.  Neurological:  Negative for extremity weakness.  Hematological:  Negative for adenopathy.  Psychiatric/Behavioral:  Negative for confusion.    MEDICAL HISTORY:  Past Medical History:  Diagnosis Date   Allergy    Anxiety    Headache    stress. approx every other day.    SURGICAL HISTORY: Past Surgical History:  Procedure Laterality Date   TONSILLECTOMY  2006     SOCIAL HISTORY: Social History   Socioeconomic History   Marital status: Single    Spouse name: Not on file   Number of children: Not on file   Years of education: Not on file   Highest education level: Not on file  Occupational History   Not on file  Tobacco Use   Smoking status: Never   Smokeless tobacco: Never  Vaping Use   Vaping Use: Never used  Substance and Sexual Activity   Alcohol use: Yes    Comment: occasional   Drug use: Never   Sexual activity: Not Currently    Birth control/protection: Pill  Other Topics Concern   Not on file  Social History Narrative   Not on file   Social Determinants of Health   Financial Resource Strain: Not on file  Food Insecurity: Not on file  Transportation Needs: Not on file  Physical Activity: Not on file  Stress: Not on file  Social Connections: Not on file  Intimate Partner Violence: Not on file    FAMILY HISTORY: Family History  Problem Relation Age of Onset   Asthma Mother    Cancer Father        skin cancer   High Cholesterol Father    Cancer Brother        ewing sarcoma   Arthritis Maternal Grandmother    Asthma Maternal Grandmother    Cancer Maternal Grandmother    Hearing loss Maternal Grandmother    Hyperlipidemia Maternal Grandmother    Hypertension Maternal Grandmother    Cancer Maternal Grandfather    Diabetes Maternal Grandfather  Heart disease Maternal Grandfather    Hyperlipidemia Maternal Grandfather    Hypertension Maternal Grandfather    Cancer Paternal Grandmother    Heart attack Paternal Grandmother    Kidney disease Paternal Grandmother    Early death Paternal Grandfather    Heart attack Paternal Grandfather     ALLERGIES:  is allergic to wellbutrin [bupropion].  MEDICATIONS:  Current Outpatient Medications  Medication Sig Dispense Refill   cetirizine (ZYRTEC) 10 MG tablet Take 10 mg by mouth daily.     docusate sodium (COLACE) 100 MG capsule Take 100 mg by mouth daily.      fluticasone (FLONASE) 50 MCG/ACT nasal spray Place 1 spray into both nostrils daily.     Multiple Vitamins-Minerals (MULTIVITAMIN ADULT PO) Take 1 tablet by mouth daily.     norgestimate-ethinyl estradiol (ORTHO-CYCLEN) 0.25-35 MG-MCG tablet TAKE 1 TABLET BY MOUTH EVERY DAY 84 tablet 3   omeprazole (PRILOSEC) 40 MG capsule Take 1 capsule (40 mg total) by mouth daily. 30 capsule 3   propranolol (INDERAL) 10 MG tablet Take 10 mg by mouth 3 (three) times daily. As needed     sertraline (ZOLOFT) 50 MG tablet TAKE 1 TABLET BY MOUTH EVERY DAY 90 tablet 2   No current facility-administered medications for this visit.     PHYSICAL EXAMINATION: Vitals:   01/27/21 1054  BP: 123/83  Pulse: (!) 102  Resp: 18  Temp: 98.6 F (37 C)   Filed Weights   01/27/21 1054  Weight: 167 lb 8 oz (76 kg)    Physical Exam Constitutional:      General: She is not in acute distress. HENT:     Head: Normocephalic and atraumatic.  Eyes:     General: No scleral icterus. Cardiovascular:     Rate and Rhythm: Normal rate and regular rhythm.     Heart sounds: Normal heart sounds.  Pulmonary:     Effort: Pulmonary effort is normal. No respiratory distress.     Breath sounds: No wheezing.  Abdominal:     General: Bowel sounds are normal. There is no distension.     Palpations: Abdomen is soft.  Musculoskeletal:        General: No deformity. Normal range of motion.     Cervical back: Normal range of motion and neck supple.  Skin:    General: Skin is warm and dry.     Findings: No erythema or rash.  Neurological:     Mental Status: She is alert and oriented to person, place, and time. Mental status is at baseline.     Cranial Nerves: No cranial nerve deficit.     Coordination: Coordination normal.  Psychiatric:        Mood and Affect: Mood normal.    LABORATORY DATA:  I have reviewed the data as listed Lab Results  Component Value Date   WBC 7.6 12/30/2020   HGB 11.6 (L) 12/30/2020   HCT 36.1  12/30/2020   MCV 78.1 (L) 12/30/2020   PLT 320 12/30/2020   Recent Labs    10/06/20 0853  NA 139  K 4.2  CL 104  CO2 23  GLUCOSE 87  BUN 10  CREATININE 0.70  CALCIUM 9.8  PROT 7.3  ALBUMIN 4.3  AST 13  ALT 9  ALKPHOS 67  BILITOT 0.4   Iron/TIBC/Ferritin/ %Sat    Component Value Date/Time   IRON 33 (L) 12/30/2020 1457   IRON 80 08/23/2017 0000   TIBC 520 (H) 12/30/2020 1457   TIBC  426 08/23/2017 0000   FERRITIN 3 (L) 12/30/2020 1457   FERRITIN 36 08/23/2017 0000   IRONPCTSAT 6 (L) 12/30/2020 1457      RADIOGRAPHIC STUDIES: I have personally reviewed the radiological images as listed and agreed with the findings in the report. No results found.    ASSESSMENT & PLAN:  1. Iron deficiency anemia due to chronic blood loss   2. Blood in stool    Labs are reviewed and discussed with patient.  This is consistent with severe iron deficiency.  Anemia is mild at 11.6.  Etiology of iron deficiency, possibly secondary to chronic GI blood loss.  Menstrual period is not heavy per patient. Discussed with patient about option of oral iron supplementation and IV Venofer treatments.  Patient opted to IV Venofer treatments. Plan IV iron with Venofer 234m weekly x 4 doses. Allergy reactions/infusion reactions including anaphylactic reaction were discussed with patient. Other side effects include but not limited to high blood pressure, skin rash, weight gain, leg swelling, etc. Patient voices understanding and willing to proceed. Denies any chance of pregnancy.  She is not sexually active.  We discussed about avoiding IV Venofer treatment during first trimester and I asked patient to update if she becomes sexually active.  Follow-up with gastroenterology for work-up of bloody stool.  Follow-up in 3 months. Orders Placed This Encounter  Procedures   CBC with Differential/Platelet    Standing Status:   Future    Standing Expiration Date:   01/27/2022   Iron and TIBC    Standing  Status:   Future    Standing Expiration Date:   01/27/2022   Ferritin    Standing Status:   Future    Standing Expiration Date:   01/27/2022    All questions were answered. The patient knows to call the clinic with any problems questions or concerns.   TVirgel Manifold MD    Return of visit:  Thank you for this kind referral and the opportunity to participate in the care of this patient. A copy of today's note is routed to referring provider   ZEarlie Server MD, PhD [facility] H[facility]ology Oncology 01/27/2021

## 2021-01-30 ENCOUNTER — Encounter
Admission: RE | Disposition: A | Discharge status: Home / Self Care | Payer: Self-pay | Source: Home / Self Care | Attending: Gastroenterology

## 2021-01-30 ENCOUNTER — Ambulatory Visit: Payer: BC Managed Care – PPO | Admitting: Anesthesiology

## 2021-01-30 ENCOUNTER — Encounter: Payer: Self-pay | Admitting: Gastroenterology

## 2021-01-30 ENCOUNTER — Other Ambulatory Visit: Payer: Self-pay

## 2021-01-30 ENCOUNTER — Ambulatory Visit
Admission: RE | Admit: 2021-01-30 | Discharge: 2021-01-30 | Disposition: A | Discharge status: Home / Self Care | Payer: BC Managed Care – PPO | Attending: Gastroenterology | Admitting: Gastroenterology

## 2021-01-30 DIAGNOSIS — D509 Iron deficiency anemia, unspecified: Secondary | ICD-10-CM | POA: Diagnosis not present

## 2021-01-30 DIAGNOSIS — R12 Heartburn: Secondary | ICD-10-CM | POA: Insufficient documentation

## 2021-01-30 DIAGNOSIS — K625 Hemorrhage of anus and rectum: Secondary | ICD-10-CM | POA: Insufficient documentation

## 2021-01-30 DIAGNOSIS — K602 Anal fissure, unspecified: Secondary | ICD-10-CM | POA: Insufficient documentation

## 2021-01-30 DIAGNOSIS — K6389 Other specified diseases of intestine: Secondary | ICD-10-CM | POA: Diagnosis not present

## 2021-01-30 DIAGNOSIS — K2960 Other gastritis without bleeding: Secondary | ICD-10-CM | POA: Insufficient documentation

## 2021-01-30 DIAGNOSIS — K529 Noninfective gastroenteritis and colitis, unspecified: Secondary | ICD-10-CM | POA: Diagnosis not present

## 2021-01-30 HISTORY — DX: Headache, unspecified: R51.9

## 2021-01-30 HISTORY — PX: ESOPHAGOGASTRODUODENOSCOPY (EGD) WITH PROPOFOL: SHX5813

## 2021-01-30 HISTORY — PX: COLONOSCOPY WITH PROPOFOL: SHX5780

## 2021-01-30 HISTORY — PX: BIOPSY: SHX5522

## 2021-01-30 LAB — POCT PREGNANCY, URINE: Preg Test, Ur: NEGATIVE

## 2021-01-30 SURGERY — ESOPHAGOGASTRODUODENOSCOPY (EGD) WITH PROPOFOL
Anesthesia: General | Site: Rectum

## 2021-01-30 MED ORDER — GLYCOPYRROLATE 0.2 MG/ML IJ SOLN
INTRAMUSCULAR | Status: DC | PRN
  Administered 2021-01-30: .1 mg via INTRAVENOUS

## 2021-01-30 MED ORDER — LIDOCAINE HCL (CARDIAC) PF 100 MG/5ML IV SOSY
PREFILLED_SYRINGE | INTRAVENOUS | Status: DC | PRN
  Administered 2021-01-30: 50 mg via INTRAVENOUS

## 2021-01-30 MED ORDER — PROPOFOL 10 MG/ML IV BOLUS
INTRAVENOUS | Status: DC | PRN
  Administered 2021-01-30: 40 mg via INTRAVENOUS
  Administered 2021-01-30: 20 mg via INTRAVENOUS
  Administered 2021-01-30: 50 mg via INTRAVENOUS
  Administered 2021-01-30: 30 mg via INTRAVENOUS
  Administered 2021-01-30: 50 mg via INTRAVENOUS
  Administered 2021-01-30: 40 mg via INTRAVENOUS
  Administered 2021-01-30 (×2): 30 mg via INTRAVENOUS
  Administered 2021-01-30: 40 mg via INTRAVENOUS
  Administered 2021-01-30: 80 mg via INTRAVENOUS
  Administered 2021-01-30 (×2): 30 mg via INTRAVENOUS
  Administered 2021-01-30: 40 mg via INTRAVENOUS
  Administered 2021-01-30: 30 mg via INTRAVENOUS
  Administered 2021-01-30: 40 mg via INTRAVENOUS
  Administered 2021-01-30: 30 mg via INTRAVENOUS
  Administered 2021-01-30: 40 mg via INTRAVENOUS

## 2021-01-30 MED ORDER — SODIUM CHLORIDE 0.9 % IV SOLN: INTRAVENOUS | Status: DC

## 2021-01-30 MED ORDER — LACTATED RINGERS IV SOLN: INTRAVENOUS | Status: DC

## 2021-01-30 MED ORDER — STERILE WATER FOR IRRIGATION IR SOLN
Status: DC | PRN
  Administered 2021-01-30: 1

## 2021-01-30 SURGICAL SUPPLY — 39 items
BALLN DILATOR 10-12 8 (BALLOONS)
BALLN DILATOR 12-15 8 (BALLOONS)
BALLN DILATOR 15-18 8 (BALLOONS)
BALLN DILATOR CRE 0-12 8 (BALLOONS)
BALLN DILATOR ESOPH 8 10 CRE (MISCELLANEOUS) IMPLANT
BALLOON DILATOR 12-15 8 (BALLOONS) IMPLANT
BALLOON DILATOR 15-18 8 (BALLOONS) IMPLANT
BALLOON DILATOR CRE 0-12 8 (BALLOONS) IMPLANT
BLOCK BITE 60FR ADLT L/F GRN (MISCELLANEOUS) ×3 IMPLANT
CLIP HMST 235XBRD CATH ROT (MISCELLANEOUS) IMPLANT
CLIP RESOLUTION 360 11X235 (MISCELLANEOUS)
ELECT REM PT RETURN 9FT ADLT (ELECTROSURGICAL)
ELECTRODE REM PT RTRN 9FT ADLT (ELECTROSURGICAL) IMPLANT
FCP ESCP3.2XJMB 240X2.8X (MISCELLANEOUS)
FORCEPS BIOP RAD 4 LRG CAP 4 (CUTTING FORCEPS) ×6 IMPLANT
FORCEPS BIOP RJ4 240 W/NDL (MISCELLANEOUS)
FORCEPS ESCP3.2XJMB 240X2.8X (MISCELLANEOUS) IMPLANT
GOWN CVR UNV OPN BCK APRN NK (MISCELLANEOUS) ×8 IMPLANT
GOWN ISOL THUMB LOOP REG UNIV (MISCELLANEOUS) ×12
INJECTOR VARIJECT VIN23 (MISCELLANEOUS) IMPLANT
KIT DEFENDO VALVE AND CONN (KITS) IMPLANT
KIT PRC NS LF DISP ENDO (KITS) ×4 IMPLANT
KIT PROCEDURE OLYMPUS (KITS) ×6
MANIFOLD NEPTUNE II (INSTRUMENTS) ×6 IMPLANT
MARKER SPOT ENDO TATTOO 5ML (MISCELLANEOUS) IMPLANT
NEEDLE CARR LOCKE SCLERO (NEEDLE) IMPLANT
PROBE APC STR FIRE (PROBE) IMPLANT
RETRIEVER NET PLAT FOOD (MISCELLANEOUS) IMPLANT
RETRIEVER NET ROTH 2.5X230 LF (MISCELLANEOUS) IMPLANT
SNARE LASSO HEX 3 IN 1 (INSTRUMENTS) IMPLANT
SNARE SHORT THROW 13M SML OVAL (MISCELLANEOUS) IMPLANT
SNARE SHORT THROW 30M LRG OVAL (MISCELLANEOUS) IMPLANT
SNARE SNG USE RND 15MM (INSTRUMENTS) IMPLANT
SPOT EX ENDOSCOPIC TATTOO (MISCELLANEOUS)
SYR INFLATION 60ML (SYRINGE) IMPLANT
TRAP ETRAP POLY (MISCELLANEOUS) IMPLANT
VARIJECT INJECTOR VIN23 (MISCELLANEOUS)
WATER STERILE IRR 250ML POUR (IV SOLUTION) ×6 IMPLANT
WIRE CRE 18-20MM 8CM F G (MISCELLANEOUS) IMPLANT

## 2021-01-30 NOTE — Op Note (Signed)
[facility]enterology Patient Name: Makayla Calderon Procedure Date: 01/30/2021 1:23 PM MRN: [id number] Account #: [id number] Date of Birth: [birth year] Admit Type: Outpatient Age: 25 Room: [facility] OR ROOM 1 Gender: Female Note Status: Finalized Instrument Name: [id number] Procedure:             Colonoscopy Indications:           Rectal bleeding, Iron deficiency anemia Providers:             Brek Reece B. Maximino Greenland MD, MD Referring MD:          Duncan Dull, MD (Referring MD) Medicines:             Monitored Anesthesia Care Complications:         No immediate complications. Procedure:             Pre-Anesthesia Assessment:                        - Prior to the procedure, a History and Physical was                         performed, and patient medications, allergies and                         sensitivities were reviewed. The patient's tolerance                         of previous anesthesia was reviewed.                        - The risks and benefits of the procedure and the                         sedation options and risks were discussed with the                         patient. All questions were answered and informed                         consent was obtained.                        - Patient identification and proposed procedure were                         verified prior to the procedure by the physician, the                         nurse, the anesthetist and the technician. The                         procedure was verified in the pre-procedure area in                         the procedure room in the endoscopy suite.                        - ASA Grade Assessment: II - A patient with mild  systemic disease.                        - After reviewing the risks and benefits, the patient                         was deemed in satisfactory condition to undergo the                         procedure.                        After obtaining  informed consent, the colonoscope was                         passed under direct vision. Throughout the procedure,                         the patient's blood pressure, pulse, and oxygen                         saturations were monitored continuously. The                         Colonoscope was introduced through the anus and                         advanced to the the terminal ileum. The colonoscopy                         was performed with ease. The patient tolerated the                         procedure well. The quality of the bowel preparation                         was good. Findings:      An anal fissure was found on perianal exam.      A patchy area of mucosa in the terminal ileum was mildly erythematous.       Biopsies were taken with a cold forceps for histology.      The rectum, sigmoid colon, descending colon, transverse colon, ascending       colon and cecum appeared normal.      The exam was otherwise normal throughout the examined colon.      Anal papilla(e) were hypertrophied.      No additional abnormalities were found on retroflexion. Impression:            - Anal fissure found on perianal exam.                        - Erythematous mucosa in the terminal ileum. Biopsied.                        - The rectum, sigmoid colon, descending colon,                         transverse colon, ascending colon and cecum are normal.                        -  Anal papilla(e) were hypertrophied.                        - Patient's intermittent BRBPR is from the anal                         fissure. Recommendation:        - Discharge patient to home.                        - Nifedipine ointment will be sent to specialty                         pharmacy for anal fissure                        - If upper endoscopy pathology does not show celiac                         disease would need to consider small bowel capsule                         study for iron deficiency anemia                         - High fiber diet.                        - Resume previous diet.                        - Continue present medications.                        - Return to primary care physician as previously                         scheduled.                        - The findings and recommendations were discussed with                         the patient.                        - The findings and recommendations were discussed with                         the patient's family. Procedure Code(s):     --- Professional ---                        [phone], Colonoscopy, flexible; with biopsy, single or                         multiple Diagnosis Code(s):     --- Professional ---                        K60.2, Anal fissure, unspecified  K62.5, Hemorrhage of anus and rectum                        D50.9, Iron deficiency anemia, unspecified                        K63.89, Other specified diseases of intestine                        K62.89, Other specified diseases of anus and rectum CPT copyright 2019 American Medical Association. All rights reserved. The codes documented in this report are preliminary and upon coder review may  be revised to meet current compliance requirements.  [name], MD Michel Bickers B. Maximino Greenland MD, MD 01/30/2021 2:10:22 PM This report has been signed electronically. Number of Addenda: 0 Note Initiated On: 01/30/2021 1:23 PM Scope Withdrawal Time: 0 hours 13 minutes 33 seconds  Total Procedure Duration: 0 hours 16 minutes 44 seconds  Estimated Blood Loss:  Estimated blood loss: none.      [facility]

## 2021-01-30 NOTE — H&P (Signed)
Makayla Bouillon, MD [address], [place], [place], [address] [address], [address], [place], [place], [address] Phone: [phone]  Fax: [phone]  Primary Care Physician:  Makayla Shams, MD   Pre-Procedure History & Physical: HPI:  Makayla Calderon is a 25 y.o. female is here for a colonoscopy and EGD.   Past Medical History:  Diagnosis Date   Allergy    Anxiety    Headache    stress. approx every other day.    Past Surgical History:  Procedure Laterality Date   TONSILLECTOMY  2006    Prior to Admission medications   Medication Sig Start Date End Date Taking? Authorizing Provider  cetirizine (ZYRTEC) 10 MG tablet Take 10 mg by mouth daily.   Yes [provider]  docusate sodium (COLACE) 100 MG capsule Take 100 mg by mouth daily.   Yes [provider]  fluticasone (FLONASE) 50 MCG/ACT nasal spray Place 1 spray into both nostrils daily.   Yes [provider]  Multiple Vitamins-Minerals (MULTIVITAMIN ADULT PO) Take 1 tablet by mouth daily.   Yes [provider]  norgestimate-ethinyl estradiol (ORTHO-CYCLEN) 0.25-35 MG-MCG tablet TAKE 1 TABLET BY MOUTH EVERY DAY 03/14/20  Yes Makayla Shams, MD  omeprazole (PRILOSEC) 40 MG capsule Take 1 capsule (40 mg total) by mouth daily. 12/30/20  Yes Makayla Shams, MD  propranolol (INDERAL) 10 MG tablet Take 10 mg by mouth 3 (three) times daily. As needed   Yes [provider]  sertraline (ZOLOFT) 50 MG tablet TAKE 1 TABLET BY MOUTH EVERY DAY 12/21/20  Yes Makayla Shams, MD    Allergies as of 01/09/2021 - Review Complete 12/30/2020  Allergen Reaction Noted   Wellbutrin [bupropion]  11/29/2020    Family History  Problem Relation Age of Onset   Asthma Mother    Cancer Father        skin cancer   High Cholesterol Father    Cancer Brother        ewing sarcoma   Arthritis Maternal Grandmother    Asthma Maternal Grandmother    Cancer Maternal Grandmother    Hearing  loss Maternal Grandmother    Hyperlipidemia Maternal Grandmother    Hypertension Maternal Grandmother    Cancer Maternal Grandfather    Diabetes Maternal Grandfather    Heart disease Maternal Grandfather    Hyperlipidemia Maternal Grandfather    Hypertension Maternal Grandfather    Cancer Paternal Grandmother    Heart attack Paternal Grandmother    Kidney disease Paternal Grandmother    Early death Paternal Grandfather    Heart attack Paternal Grandfather     Social History   Socioeconomic History   Marital status: Single    Spouse name: Not on file   Number of children: Not on file   Years of education: Not on file   Highest education level: Not on file  Occupational History   Not on file  Tobacco Use   Smoking status: Never   Smokeless tobacco: Never  Vaping Use   Vaping Use: Never used  Substance and Sexual Activity   Alcohol use: Yes    Comment: occasional   Drug use: Never   Sexual activity: Not Currently    Birth control/protection: Pill  Other Topics Concern   Not on file  Social History Narrative   Not on file   Social Determinants of Health   Financial Resource Strain: Not on file  Food Insecurity: Not on file  Transportation Needs: Not  on file  Physical Activity: Not on file  Stress: Not on file  Social Connections: Not on file  Intimate Partner Violence: Not on file    Review of Systems: See HPI, otherwise negative ROS  Constitutional: General:   Alert,  Well-developed, well-nourished, pleasant and cooperative in NAD BP 123/83   Pulse 93   Temp 98.1 F (36.7 C) (Temporal)   Ht 5\' 5"  (1.651 m)   Wt 73.9 kg   LMP 11/22/2020 (Approximate)   SpO2 97%   BMI 27.12 kg/m   Head: Normocephalic, atraumatic.   Eyes:  Sclera clear, no icterus.   Conjunctiva pink.   Mouth:  No deformity or lesions, oropharynx pink & moist.  Neck:  Supple, trachea midline  Respiratory: Normal respiratory effort  Gastrointestinal:  Soft, non-tender and  non-distended without masses, hepatosplenomegaly or hernias noted.  No guarding or rebound tenderness.     Cardiac: No clubbing or edema.  No cyanosis. Normal posterior tibial pedal pulses noted.  Lymphatic:  No significant cervical adenopathy.  Psych:  Alert and cooperative. Normal mood and affect.  Musculoskeletal:   Symmetrical without gross deformities. 5/5 Lower extremity strength bilaterally.  Skin: Warm. Intact without significant lesions or rashes. No jaundice.  Neurologic:  Face symmetrical, tongue midline, Normal sensation to touch;  grossly normal neurologically.  Psych:  Alert and oriented x3, Alert and cooperative. Normal mood and affect.  Impression/Plan: Makayla Calderon is here for a colonoscopy  and EGD for iron deficiency anemia and bright red blood per rectum  Risks, benefits, limitations, and alternatives regarding the procedures have been reviewed with the patient.  Questions have been answered.  All parties agreeable.   Clemens Catholic, MD  01/30/2021, 1:25 PM

## 2021-01-30 NOTE — Anesthesia Preprocedure Evaluation (Signed)
Anesthesia Evaluation  Patient identified by MRN, date of birth, ID band Patient awake    Reviewed: Allergy & Precautions, H&P , NPO status , Patient's Chart, lab work & pertinent test results  Airway Mallampati: II  TM Distance: >3 FB Neck ROM: full    Dental no notable dental hx.    Pulmonary    Pulmonary exam normal breath sounds clear to auscultation       Cardiovascular Normal cardiovascular exam Rhythm:regular Rate:Normal     Neuro/Psych  Headaches, PSYCHIATRIC DISORDERS    GI/Hepatic   Endo/Other    Renal/GU      Musculoskeletal   Abdominal   Peds  Hematology   Anesthesia Other Findings   Reproductive/Obstetrics                             Anesthesia Physical Anesthesia Plan  ASA: 2  Anesthesia Plan: General   Post-op Pain Management:    Induction: Intravenous  PONV Risk Score and Plan: 3 and Treatment may vary due to age or medical condition, Propofol infusion and TIVA  Airway Management Planned: Natural Airway  Additional Equipment:   Intra-op Plan:   Post-operative Plan:   Informed Consent: I have reviewed the patients History and Physical, chart, labs and discussed the procedure including the risks, benefits and alternatives for the proposed anesthesia with the patient or authorized representative who has indicated his/her understanding and acceptance.     Dental Advisory Given  Plan Discussed with: CRNA  Anesthesia Plan Comments:         Anesthesia Quick Evaluation

## 2021-01-30 NOTE — Anesthesia Postprocedure Evaluation (Signed)
Anesthesia Post Note  Patient: Makayla Calderon  Procedure(s) Performed: ESOPHAGOGASTRODUODENOSCOPY (EGD) WITH PROPOFOL (Esophagus) BIOPSY (Esophagus) COLONOSCOPY WITH PROPOFOL (Rectum)     Patient location during evaluation: PACU Anesthesia Type: General Level of consciousness: awake and alert and oriented Pain management: satisfactory to patient Vital Signs Assessment: post-procedure vital signs reviewed and stable Respiratory status: spontaneous breathing, nonlabored ventilation and respiratory function stable Cardiovascular status: blood pressure returned to baseline and stable Postop Assessment: Adequate PO intake and No signs of nausea or vomiting Anesthetic complications: no   No notable events documented.  Cherly Beach

## 2021-01-30 NOTE — Anesthesia Procedure Notes (Signed)
Date/Time: 01/30/2021 1:33 PM Performed by: Jimmy Picket, CRNA Pre-anesthesia Checklist: Patient identified, Emergency Drugs available, Suction available, Timeout performed and Patient being monitored Patient Re-evaluated:Patient Re-evaluated prior to induction Oxygen Delivery Method: Nasal cannula Placement Confirmation: positive ETCO2

## 2021-01-30 NOTE — Transfer of Care (Signed)
Immediate Anesthesia Transfer of Care Note  Patient: Makayla Calderon  Procedure(s) Performed: ESOPHAGOGASTRODUODENOSCOPY (EGD) WITH PROPOFOL (Esophagus) BIOPSY (Esophagus) COLONOSCOPY WITH PROPOFOL (Rectum)  Patient Location: PACU  Anesthesia Type: General  Level of Consciousness: awake, alert  and patient cooperative  Airway and Oxygen Therapy: Patient Spontanous Breathing and Patient connected to supplemental oxygen  Post-op Assessment: Post-op Vital signs reviewed, Patient's Cardiovascular Status Stable, Respiratory Function Stable, Patent Airway and No signs of Nausea or vomiting  Post-op Vital Signs: Reviewed and stable  Complications: No notable events documented.

## 2021-01-30 NOTE — Op Note (Signed)
[facility] Gastroenterology Patient Name: Makayla Calderon Procedure Date: 01/30/2021 1:24 PM MRN: [id number] Account #: [id number] Date of Birth: 08/13/1995 Admit Type: Outpatient Age: 25 Room: [facility] OR ROOM 01 Gender: Female Note Status: Finalized Instrument Name: [id number] Procedure:             Upper GI endoscopy Indications:           Heartburn, Iron deficiency anemia Providers:             Maciah Feeback B. Maximino Greenland MD, MD Referring MD:          Duncan Dull, MD (Referring MD) Medicines:             Monitored Anesthesia Care Complications:         No immediate complications. Procedure:             Pre-Anesthesia Assessment:                        - Prior to the procedure, a History and Physical was                         performed, and patient medications, allergies and                         sensitivities were reviewed. The patient's tolerance                         of previous anesthesia was reviewed.                        - The risks and benefits of the procedure and the                         sedation options and risks were discussed with the                         patient. All questions were answered and informed                         consent was obtained.                        - Patient identification and proposed procedure were                         verified prior to the procedure by the physician, the                         nurse, the anesthesiologist, the anesthetist and the                         technician. The procedure was verified in the                         procedure room.                        - ASA Grade Assessment: II - A patient with mild  systemic disease.                        After obtaining informed consent, the endoscope was                         passed under direct vision. Throughout the procedure,                         the patient's blood pressure, pulse, and oxygen                          saturations were monitored continuously. The Endoscope                         was introduced through the mouth, and advanced to the                         second part of duodenum. The upper GI endoscopy was                         accomplished with ease. The patient tolerated the                         procedure well. Findings:      The examined esophagus was normal.      The entire examined stomach was normal. Biopsies were obtained in the       gastric body, at the incisura and in the gastric antrum with cold       forceps for histology. Biopsies were taken with a cold forceps for       Helicobacter pylori testing.      The duodenal bulb, second portion of the duodenum and examined duodenum       were normal. Biopsies for histology were taken with a cold forceps for       evaluation of celiac disease. Biopsies were obtained in the duodenal       bulb and in the second portion of the duodenum with cold forceps for       evaluation of celiac disease. Ampulla was not seen on this exam.       Biopsies were done away from the area where the ampulla is usually seen       to prevent biopsies near the ampulla. Impression:            - Normal esophagus.                        - Normal stomach. Biopsied.                        - Normal duodenal bulb, second portion of the duodenum                         and examined duodenum. Biopsied.                        - Biopsies were obtained in the gastric body, at the                         incisura and in the gastric  antrum.                        - Biopsies were obtained in the duodenal bulb and in                         the second portion of the duodenum. Recommendation:        - Await pathology results.                        - Discharge patient to home (with escort).                        - Advance diet as tolerated.                        - Continue present medications.                        - Patient has a contact number available for                          emergencies. The signs and symptoms of potential                         delayed complications were discussed with the patient.                         Return to normal activities tomorrow. Written                         discharge instructions were provided to the patient.                        - Discharge patient to home (with escort).                        - The findings and recommendations were discussed with                         the patient.                        - The findings and recommendations were discussed with                         the patient's family. Procedure Code(s):     --- Professional ---                        [phone], Esophagogastroduodenoscopy, flexible,                         transoral; with biopsy, single or multiple Diagnosis Code(s):     --- Professional ---                        R12, Heartburn                        D50.9, Iron deficiency anemia, unspecified CPT copyright 2019 American Medical Association. All rights reserved. The codes documented  in this report are preliminary and upon coder review may  be revised to meet current compliance requirements.  [name], MD Michel Bickers B. Maximino Greenland MD, MD 01/30/2021 1:47:24 PM This report has been signed electronically. Number of Addenda: 0 Note Initiated On: 01/30/2021 1:24 PM Total Procedure Duration: 0 hours 8 minutes 20 seconds  Estimated Blood Loss:  Estimated blood loss: none.      [facility]

## 2021-01-31 ENCOUNTER — Encounter: Payer: Self-pay | Admitting: Gastroenterology

## 2021-02-01 ENCOUNTER — Inpatient Hospital Stay: Payer: BC Managed Care – PPO

## 2021-02-01 ENCOUNTER — Other Ambulatory Visit: Payer: Self-pay

## 2021-02-01 ENCOUNTER — Telehealth: Payer: Self-pay

## 2021-02-01 VITALS — BP 126/69 | HR 71 | Temp 96.9°F | Resp 18

## 2021-02-01 DIAGNOSIS — D500 Iron deficiency anemia secondary to blood loss (chronic): Secondary | ICD-10-CM

## 2021-02-01 DIAGNOSIS — D509 Iron deficiency anemia, unspecified: Secondary | ICD-10-CM | POA: Diagnosis not present

## 2021-02-01 MED ORDER — SODIUM CHLORIDE 0.9 % IV SOLN
Freq: Once | INTRAVENOUS | Status: AC
  Filled 2021-02-01: qty 250

## 2021-02-01 MED ORDER — IRON SUCROSE 20 MG/ML IV SOLN
200.0000 mg | Freq: Once | INTRAVENOUS | Status: AC
  Administered 2021-02-01: 200 mg via INTRAVENOUS
  Filled 2021-02-01: qty 10

## 2021-02-01 MED ORDER — SODIUM CHLORIDE 0.9 % IV SOLN: 200.0000 mg | Freq: Once | INTRAVENOUS | Status: DC

## 2021-02-01 NOTE — Telephone Encounter (Signed)
Pasty Spillers, MD  Dovid Bartko, Brendia Sacks, CMA Please send nifedipine ointment to specialty pharmacy for 4 weeks. I can sign the form in clinic   Rx has been faxed to [facility]

## 2021-02-01 NOTE — Patient Instructions (Addendum)
[facility]  Discharge Instructions: °Thank you for choosing [facility] to provide your oncology and hematology care.  °If you have a lab appointment with the Cancer Center, please go directly to the Cancer Center and check in at the registration area. ° °Wear comfortable clothing and clothing appropriate for easy access to any Portacath or PICC line.  ° °We strive to give you quality time with your provider. You may need to reschedule your appointment if you arrive late (15 or more minutes).  Arriving late affects you and other patients whose appointments are after yours.  Also, if you miss three or more appointments without notifying the office, you may be dismissed from the clinic at the provider’s discretion.    °  °For prescription refill requests, have your pharmacy contact our office and allow 72 hours for refills to be completed.   ° °Today you received the following chemotherapy and/or immunotherapy agents venofer    °  °To help prevent nausea and vomiting after your treatment, we encourage you to take your nausea medication as directed. ° °BELOW ARE SYMPTOMS THAT SHOULD BE REPORTED IMMEDIATELY: °*FEVER GREATER THAN 100.4 F (38 °C) OR HIGHER °*CHILLS OR SWEATING °*NAUSEA AND VOMITING THAT IS NOT CONTROLLED WITH YOUR NAUSEA MEDICATION °*UNUSUAL SHORTNESS OF BREATH °*UNUSUAL BRUISING OR BLEEDING °*URINARY PROBLEMS (pain or burning when urinating, or frequent urination) °*BOWEL PROBLEMS (unusual diarrhea, constipation, pain near the anus) °TENDERNESS IN MOUTH AND THROAT WITH OR WITHOUT PRESENCE OF ULCERS (sore throat, sores in mouth, or a toothache) °UNUSUAL RASH, SWELLING OR PAIN  °UNUSUAL VAGINAL DISCHARGE OR ITCHING  ° °Items with * indicate a potential emergency and should be followed up as soon as possible or go to the Emergency Department if any problems should occur. ° °Please show the CHEMOTHERAPY ALERT CARD or IMMUNOTHERAPY ALERT CARD at check-in to the  Emergency Department and triage nurse. ° °Should you have questions after your visit or need to cancel or reschedule your appointment, please contact [facility]  [phone] and follow the prompts.  Office hours are 8:00 a.m. to 4:30 p.m. Monday - Friday. Please note that voicemails left after 4:00 p.m. may not be returned until the following business day.  We are closed weekends and major holidays. You have access to a nurse at all times for urgent questions. Please call the main number to the clinic [phone] and follow the prompts. ° °For any non-urgent questions, you may also contact your provider using MyChart. We now offer e-Visits for anyone 18 and older to request care online for non-urgent symptoms. For details visit [email or web address]. °  °Also download the MyChart app! Go to the app store, search "MyChart", open the app, select [place], and log in with your MyChart username and password. ° °Due to Covid, a mask is required upon entering the hospital/clinic. If you do not have a mask, one will be given to you upon arrival. For doctor visits, patients may have 1 support person aged 18 or older with them. For treatment visits, patients cannot have anyone with them due to current Covid guidelines and our immunocompromised population.  ° °Iron Sucrose Injection °What is this medication? °IRON SUCROSE (EYE ern SOO krose) treats low levels of iron (iron deficiency anemia) in people with kidney disease. Iron is a mineral that plays an important role in making red blood cells, which carry oxygen from your lungs to the rest of your body. °This medicine may   be used for other purposes; ask your health care provider or pharmacist if you have questions. °COMMON BRAND NAME(S): Venofer °What should I tell my care team before I take this medication? °They need to know if you have any of these conditions: °Anemia not caused by low iron levels °Heart disease °High levels of  iron in the blood °Kidney disease °Liver disease °An unusual or allergic reaction to iron, other medications, foods, dyes, or preservatives °Pregnant or trying to get pregnant °Breast-feeding °How should I use this medication? °This medication is for infusion into a vein. It is given in a hospital or clinic setting. °Talk to your care team about the use of this medication in children. While this medication may be prescribed for children as young as 2 years for selected conditions, precautions do apply. °Overdosage: If you think you have taken too much of this medicine contact a poison control center or emergency room at once. °NOTE: This medicine is only for you. Do not share this medicine with others. °What if I miss a dose? °It is important not to miss your dose. Call your care team if you are unable to keep an appointment. °What may interact with this medication? °Do not take this medication with any of the following: °Deferoxamine °Dimercaprol °Other iron products °This medication may also interact with the following: °Chloramphenicol °Deferasirox °This list may not describe all possible interactions. Give your health care provider a list of all the medicines, herbs, non-prescription drugs, or dietary supplements you use. Also tell them if you smoke, drink alcohol, or use illegal drugs. Some items may interact with your medicine. °What should I watch for while using this medication? °Visit your care team regularly. Tell your care team if your symptoms do not start to get better or if they get worse. You may need blood work done while you are taking this medication. °You may need to follow a special diet. Talk to your care team. Foods that contain iron include: whole grains/cereals, dried fruits, beans, or peas, leafy green vegetables, and organ meats (liver, kidney). °What side effects may I notice from receiving this medication? °Side effects that you should report to your care team as soon as  possible: °Allergic reactions--skin rash, itching, hives, swelling of the face, lips, tongue, or throat °Low blood pressure--dizziness, feeling faint or lightheaded, blurry vision °Shortness of breath °Side effects that usually do not require medical attention (report to your care team if they continue or are bothersome): °Flushing °Headache °Joint pain °Muscle pain °Nausea °Pain, redness, or irritation at injection site °This list may not describe all possible side effects. Call your doctor for medical advice about side effects. You may report side effects to FDA at 1-800-FDA-1088. °Where should I keep my medication? °This medication is given in a hospital or clinic and will not be stored at home. °NOTE: This sheet is a summary. It may not cover all possible information. If you have questions about this medicine, talk to your doctor, pharmacist, or health care provider. °© 2022 Elsevier/Gold Standard (2020-07-08 00:00:00) ° °

## 2021-02-02 ENCOUNTER — Encounter: Payer: Self-pay | Admitting: Gastroenterology

## 2021-02-03 LAB — SURGICAL PATHOLOGY

## 2021-02-06 ENCOUNTER — Other Ambulatory Visit: Payer: Self-pay

## 2021-02-06 DIAGNOSIS — D509 Iron deficiency anemia, unspecified: Secondary | ICD-10-CM

## 2021-02-08 ENCOUNTER — Other Ambulatory Visit: Payer: Self-pay

## 2021-02-08 ENCOUNTER — Inpatient Hospital Stay: Payer: BC Managed Care – PPO

## 2021-02-08 VITALS — BP 116/65 | HR 75 | Temp 97.8°F | Resp 18

## 2021-02-08 DIAGNOSIS — D500 Iron deficiency anemia secondary to blood loss (chronic): Secondary | ICD-10-CM

## 2021-02-08 DIAGNOSIS — D509 Iron deficiency anemia, unspecified: Secondary | ICD-10-CM | POA: Diagnosis not present

## 2021-02-08 MED ORDER — IRON SUCROSE 20 MG/ML IV SOLN
200.0000 mg | Freq: Once | INTRAVENOUS | Status: AC
  Administered 2021-02-08: 15:00:00 200 mg via INTRAVENOUS
  Filled 2021-02-08: qty 10

## 2021-02-08 MED ORDER — SODIUM CHLORIDE 0.9 % IV SOLN: 200.0000 mg | Freq: Once | INTRAVENOUS | Status: DC

## 2021-02-08 MED ORDER — SODIUM CHLORIDE 0.9 % IV SOLN
Freq: Once | INTRAVENOUS | Status: AC
  Filled 2021-02-08: qty 250

## 2021-02-08 NOTE — Patient Instructions (Signed)

## 2021-02-09 ENCOUNTER — Other Ambulatory Visit: Payer: Self-pay | Admitting: Gastroenterology

## 2021-02-09 DIAGNOSIS — K31A0 Gastric intestinal metaplasia, unspecified: Secondary | ICD-10-CM

## 2021-02-11 ENCOUNTER — Other Ambulatory Visit: Payer: Self-pay | Admitting: Internal Medicine

## 2021-02-14 ENCOUNTER — Encounter: Payer: Self-pay | Admitting: Gastroenterology

## 2021-02-15 ENCOUNTER — Encounter: Payer: Self-pay | Admitting: Gastroenterology

## 2021-02-15 ENCOUNTER — Inpatient Hospital Stay: Payer: BC Managed Care – PPO

## 2021-02-17 ENCOUNTER — Encounter: Payer: Self-pay | Admitting: Gastroenterology

## 2021-02-21 ENCOUNTER — Encounter: Payer: Self-pay | Admitting: Gastroenterology

## 2021-02-22 ENCOUNTER — Inpatient Hospital Stay: Payer: BC Managed Care – PPO

## 2021-02-22 ENCOUNTER — Other Ambulatory Visit: Payer: Self-pay

## 2021-02-22 NOTE — Progress Notes (Signed)
Multiple nurses attempted to gain IV access and were unsuccessful. Makayla Calderon agrees to keep her appointments as schedule and add another venofer infusion to her appointments since she did not receive venofer today. Makayla Calderon agrees to plan. Makayla Calderon stable for discharge.   Sherrita Riederer [employer]

## 2021-02-24 LAB — H PYLORI, IGM, IGG, IGA AB
H pylori, IgM Abs: <9 units (ref 0.0–8.9)
H. pylori, IgA Abs: <9 units (ref 0.0–8.9)
H. pylori, IgG AbS: 0.12 Index Value (ref 0.00–0.79)

## 2021-02-27 ENCOUNTER — Encounter: Payer: Self-pay | Admitting: Gastroenterology

## 2021-02-28 ENCOUNTER — Encounter
Admission: RE | Disposition: A | Discharge status: Home / Self Care | Payer: Self-pay | Source: Home / Self Care | Attending: Gastroenterology

## 2021-02-28 ENCOUNTER — Ambulatory Visit
Admission: RE | Admit: 2021-02-28 | Discharge: 2021-02-28 | Disposition: A | Discharge status: Home / Self Care | Payer: BC Managed Care – PPO | Attending: Gastroenterology | Admitting: Gastroenterology

## 2021-02-28 DIAGNOSIS — D509 Iron deficiency anemia, unspecified: Secondary | ICD-10-CM | POA: Insufficient documentation

## 2021-02-28 HISTORY — PX: GIVENS CAPSULE STUDY: SHX5432

## 2021-02-28 SURGERY — IMAGING PROCEDURE, GI TRACT, INTRALUMINAL, VIA CAPSULE

## 2021-03-01 ENCOUNTER — Encounter: Payer: Self-pay | Admitting: Gastroenterology

## 2021-03-01 ENCOUNTER — Inpatient Hospital Stay: Payer: BC Managed Care – PPO | Attending: Oncology

## 2021-03-01 ENCOUNTER — Other Ambulatory Visit: Payer: Self-pay

## 2021-03-01 VITALS — BP 123/72 | HR 81 | Temp 97.3°F

## 2021-03-01 DIAGNOSIS — Z79899 Other long term (current) drug therapy: Secondary | ICD-10-CM | POA: Diagnosis not present

## 2021-03-01 DIAGNOSIS — D500 Iron deficiency anemia secondary to blood loss (chronic): Secondary | ICD-10-CM | POA: Diagnosis not present

## 2021-03-01 MED ORDER — SODIUM CHLORIDE 0.9 % IV SOLN
Freq: Once | INTRAVENOUS | Status: AC
  Filled 2021-03-01: qty 250

## 2021-03-01 MED ORDER — IRON SUCROSE 20 MG/ML IV SOLN
200.0000 mg | Freq: Once | INTRAVENOUS | Status: AC
  Administered 2021-03-01: 200 mg via INTRAVENOUS
  Filled 2021-03-01: qty 10

## 2021-03-01 MED ORDER — SODIUM CHLORIDE 0.9 % IV SOLN: 200.0000 mg | Freq: Once | INTRAVENOUS | Status: DC

## 2021-03-03 ENCOUNTER — Encounter: Payer: Self-pay | Admitting: Gastroenterology

## 2021-03-08 ENCOUNTER — Inpatient Hospital Stay: Payer: BC Managed Care – PPO

## 2021-03-08 ENCOUNTER — Other Ambulatory Visit: Payer: Self-pay

## 2021-03-08 VITALS — BP 117/70 | HR 75 | Temp 99.0°F | Resp 18

## 2021-03-08 DIAGNOSIS — D500 Iron deficiency anemia secondary to blood loss (chronic): Secondary | ICD-10-CM | POA: Diagnosis not present

## 2021-03-08 MED ORDER — SODIUM CHLORIDE 0.9 % IV SOLN
Freq: Once | INTRAVENOUS | Status: AC
  Filled 2021-03-08: qty 250

## 2021-03-08 MED ORDER — IRON SUCROSE 20 MG/ML IV SOLN
200.0000 mg | Freq: Once | INTRAVENOUS | Status: AC
  Administered 2021-03-08: 200 mg via INTRAVENOUS
  Filled 2021-03-08: qty 10

## 2021-03-08 MED ORDER — SODIUM CHLORIDE 0.9 % IV SOLN: 200.0000 mg | Freq: Once | INTRAVENOUS | Status: DC

## 2021-03-30 ENCOUNTER — Encounter: Payer: Self-pay | Admitting: Internal Medicine

## 2021-03-30 MED ORDER — PROPRANOLOL HCL 10 MG PO TABS: 10.0000 mg | ORAL_TABLET | Freq: Three times a day (TID) | ORAL | 0 refills | Status: DC

## 2021-04-19 ENCOUNTER — Other Ambulatory Visit: Payer: Self-pay | Admitting: *Deleted

## 2021-04-19 DIAGNOSIS — D500 Iron deficiency anemia secondary to blood loss (chronic): Secondary | ICD-10-CM

## 2021-04-26 ENCOUNTER — Inpatient Hospital Stay: Payer: BC Managed Care – PPO | Attending: Oncology

## 2021-04-26 ENCOUNTER — Other Ambulatory Visit: Payer: Self-pay

## 2021-04-26 DIAGNOSIS — Z79899 Other long term (current) drug therapy: Secondary | ICD-10-CM | POA: Diagnosis not present

## 2021-04-26 DIAGNOSIS — R5383 Other fatigue: Secondary | ICD-10-CM | POA: Diagnosis not present

## 2021-04-26 DIAGNOSIS — D500 Iron deficiency anemia secondary to blood loss (chronic): Secondary | ICD-10-CM

## 2021-04-26 DIAGNOSIS — D509 Iron deficiency anemia, unspecified: Secondary | ICD-10-CM | POA: Diagnosis present

## 2021-04-26 LAB — IRON AND TIBC
Iron: 74 ug/dL (ref 28–170)
Saturation Ratios: 17 % (ref 10.4–31.8)
TIBC: 427 ug/dL (ref 250–450)
UIBC: 353 ug/dL

## 2021-04-26 LAB — CBC WITH DIFFERENTIAL/PLATELET
Abs Immature Granulocytes: 0.02 10*3/uL (ref 0.00–0.07)
Basophils Absolute: 0.1 10*3/uL (ref 0.0–0.1)
Basophils Relative: 1 %
Eosinophils Absolute: 0.1 10*3/uL (ref 0.0–0.5)
Eosinophils Relative: 1 %
HCT: 40.4 % (ref 36.0–46.0)
Hemoglobin: 13.4 g/dL (ref 12.0–15.0)
Immature Granulocytes: 0 %
Lymphocytes Relative: 34 %
Lymphs Abs: 2.9 10*3/uL (ref 0.7–4.0)
MCH: 28.3 pg (ref 26.0–34.0)
MCHC: 33.2 g/dL (ref 30.0–36.0)
MCV: 85.2 fL (ref 80.0–100.0)
Monocytes Absolute: 0.5 10*3/uL (ref 0.1–1.0)
Monocytes Relative: 6 %
Neutro Abs: 5 10*3/uL (ref 1.7–7.7)
Neutrophils Relative %: 58 %
Platelets: 311 10*3/uL (ref 150–400)
RBC: 4.74 MIL/uL (ref 3.87–5.11)
RDW: 15.6 % — ABNORMAL HIGH (ref 11.5–15.5)
WBC: 8.5 10*3/uL (ref 4.0–10.5)
nRBC: 0 % (ref 0.0–0.2)

## 2021-04-26 LAB — FERRITIN: Ferritin: 114 ng/mL (ref 11–307)

## 2021-04-28 ENCOUNTER — Inpatient Hospital Stay (HOSPITAL_BASED_OUTPATIENT_CLINIC_OR_DEPARTMENT_OTHER): Payer: BC Managed Care – PPO | Admitting: Oncology

## 2021-04-28 ENCOUNTER — Inpatient Hospital Stay: Payer: BC Managed Care – PPO

## 2021-04-28 ENCOUNTER — Encounter: Payer: Self-pay | Admitting: Oncology

## 2021-04-28 ENCOUNTER — Other Ambulatory Visit: Payer: Self-pay

## 2021-04-28 VITALS — BP 117/73 | HR 65 | Temp 97.0°F | Resp 18 | Wt 163.2 lb

## 2021-04-28 DIAGNOSIS — D500 Iron deficiency anemia secondary to blood loss (chronic): Secondary | ICD-10-CM | POA: Diagnosis not present

## 2021-04-28 DIAGNOSIS — D509 Iron deficiency anemia, unspecified: Secondary | ICD-10-CM | POA: Diagnosis not present

## 2021-04-28 NOTE — Progress Notes (Signed)
Hematology/Oncology Progress note [phone] [phone]         Patient Care Team: Crecencio Mc, MD as PCP - General (Internal Medicine)  REFERRING PROVIDER: Crecencio Mc, MD  CHIEF COMPLAINTS/REASON FOR VISIT:  iron deficiency anemia  HISTORY OF PRESENTING ILLNESS:   Makayla Calderon is a  26 y.o.  female with PMH listed below was seen in consultation at the request of  Crecencio Mc, MD  for evaluation of iron deficiency anemia Patient was accompanied by her mother. Patient has had abdominal pain/constipation, reflux.  She has noticed blood in her stool recently  she has been seen by gastroenterology and will have colonoscopy work-up next week. 12/30/2020, CBC showed hemoglobin 11.6, MCV 78.1.  Iron saturation 6, ferritin 3. Elevated CRP, ESR 25. She reports that menstrual period flow is not heavy. Patient reports feeling fatigued and tired. She denies any history of autoimmune disease.  INTERVAL [name] is a 26 y.o. female who has above history reviewed by me today presents for follow up visit for IDA S/p IV venofer treatments, she tolerated well.  Fatigue is better, however she still has mild fatigue.  01/30/2021 colonoscopy Anal fissure found on perianal exam- source of BRBPR  01/30/22 Erythematous mucosa in the terminal ileum. Biopsied.-small intestinal mucosa with reactive lymphoid aggregates.  EGD showed normal examination. Stomach biopsy showed mild gastritis.  04/03/2021 small bowel capsule study normal.   Review of Systems  Constitutional:  Positive for fatigue. Negative for appetite change, chills and fever.  HENT:   Negative for hearing loss and voice change.   Eyes:  Negative for eye problems.  Respiratory:  Negative for chest tightness and cough.   Cardiovascular:  Negative for chest pain.  Gastrointestinal:  Negative for abdominal distention, abdominal pain and blood in stool.  Endocrine: Negative for hot flashes.   Genitourinary:  Negative for difficulty urinating and frequency.   Musculoskeletal:  Negative for arthralgias.  Skin:  Negative for itching and rash.  Neurological:  Negative for extremity weakness.  Hematological:  Negative for adenopathy.  Psychiatric/Behavioral:  Negative for confusion.    MEDICAL HISTORY:  Past Medical History:  Diagnosis Date   Allergy    Anxiety    Headache    stress. approx every other day.    SURGICAL HISTORY: Past Surgical History:  Procedure Laterality Date   BIOPSY N/A 01/30/2021   Procedure: BIOPSY;  Surgeon: Virgel Manifold, MD;  Location: [place];  Service: Endoscopy;  Laterality: N/A;   COLONOSCOPY WITH PROPOFOL N/A 01/30/2021   Procedure: COLONOSCOPY WITH PROPOFOL;  Surgeon: Virgel Manifold, MD;  Location: [place];  Service: Endoscopy;  Laterality: N/A;   ESOPHAGOGASTRODUODENOSCOPY (EGD) WITH PROPOFOL N/A 01/30/2021   Procedure: ESOPHAGOGASTRODUODENOSCOPY (EGD) WITH PROPOFOL;  Surgeon: Virgel Manifold, MD;  Location: [place];  Service: Endoscopy;  Laterality: N/A;   GIVENS CAPSULE STUDY N/A 02/28/2021   Procedure: GIVENS CAPSULE STUDY;  Surgeon: Virgel Manifold, MD;  Location: [facility];  Service: Endoscopy;  Laterality: N/A;   TONSILLECTOMY  2006    SOCIAL HISTORY: Social History   Socioeconomic History   Marital status: Single    Spouse name: Not on file   Number of children: Not on file   Years of education: Not on file   Highest education level: Not on file  Occupational History   Not on file  Tobacco Use   Smoking status: Never   Smokeless tobacco: Never  Vaping Use  Vaping Use: Never used  Substance and Sexual Activity   Alcohol use: Yes    Comment: occasional   Drug use: Never   Sexual activity: Not Currently    Birth control/protection: Pill  Other Topics Concern   Not on file  Social History Narrative   Not on file   Social Determinants of Health   Financial  Resource Strain: Not on file  Food Insecurity: Not on file  Transportation Needs: Not on file  Physical Activity: Not on file  Stress: Not on file  Social Connections: Not on file  Intimate Partner Violence: Not on file    FAMILY HISTORY: Family History  Problem Relation Age of Onset   Asthma Mother    Cancer Father        skin cancer   High Cholesterol Father    Cancer Brother        ewing sarcoma   Arthritis Maternal Grandmother    Asthma Maternal Grandmother    Cancer Maternal Grandmother    Hearing loss Maternal Grandmother    Hyperlipidemia Maternal Grandmother    Hypertension Maternal Grandmother    Cancer Maternal Grandfather    Diabetes Maternal Grandfather    Heart disease Maternal Grandfather    Hyperlipidemia Maternal Grandfather    Hypertension Maternal Grandfather    Cancer Paternal Grandmother    Heart attack Paternal Grandmother    Kidney disease Paternal Grandmother    Early death Paternal Grandfather    Heart attack Paternal Grandfather     ALLERGIES:  is allergic to wellbutrin [bupropion].  MEDICATIONS:  Current Outpatient Medications  Medication Sig Dispense Refill   cetirizine (ZYRTEC) 10 MG tablet Take 10 mg by mouth daily.     docusate sodium (COLACE) 100 MG capsule Take 100 mg by mouth daily.     fluticasone (FLONASE) 50 MCG/ACT nasal spray Place 1 spray into both nostrils daily.     Multiple Vitamins-Minerals (MULTIVITAMIN ADULT PO) Take 1 tablet by mouth daily.     norgestimate-ethinyl estradiol (ORTHO-CYCLEN) 0.25-35 MG-MCG tablet TAKE 1 TABLET BY MOUTH EVERY DAY 84 tablet 3   omeprazole (PRILOSEC) 40 MG capsule Take 1 capsule (40 mg total) by mouth daily. 30 capsule 3   propranolol (INDERAL) 10 MG tablet Take 1 tablet (10 mg total) by mouth 3 (three) times daily. As needed 30 tablet 0   sertraline (ZOLOFT) 50 MG tablet TAKE 1 TABLET BY MOUTH EVERY DAY 90 tablet 2   No current facility-administered medications for this visit.      PHYSICAL EXAMINATION: Vitals:   04/28/21 1147  BP: 117/73  Pulse: 65  Resp: 18  Temp: (!) 97 F (36.1 C)   Filed Weights   04/28/21 1147  Weight: 163 lb 3.2 oz (74 kg)    Physical Exam Constitutional:      General: She is not in acute distress. HENT:     Head: Normocephalic and atraumatic.  Eyes:     General: No scleral icterus. Cardiovascular:     Rate and Rhythm: Normal rate.  Pulmonary:     Effort: Pulmonary effort is normal. No respiratory distress.  Abdominal:     General: There is no distension.  Musculoskeletal:        General: No deformity. Normal range of motion.     Cervical back: Normal range of motion and neck supple.  Skin:    Findings: No erythema or rash.  Neurological:     Mental Status: She is alert and oriented to person, place, and  time. Mental status is at baseline.     Cranial Nerves: No cranial nerve deficit.     Coordination: Coordination normal.  Psychiatric:        Mood and Affect: Mood normal.    LABORATORY DATA:  I have reviewed the data as listed Lab Results  Component Value Date   WBC 8.5 04/26/2021   HGB 13.4 04/26/2021   HCT 40.4 04/26/2021   MCV 85.2 04/26/2021   PLT 311 04/26/2021   Recent Labs    10/06/20 0853  NA 139  K 4.2  CL 104  CO2 23  GLUCOSE 87  BUN 10  CREATININE 0.70  CALCIUM 9.8  PROT 7.3  ALBUMIN 4.3  AST 13  ALT 9  ALKPHOS 67  BILITOT 0.4    Iron/TIBC/Ferritin/ %Sat    Component Value Date/Time   IRON 74 04/26/2021 1436   IRON 80 08/23/2017 0000   TIBC 427 04/26/2021 1436   TIBC 426 08/23/2017 0000   FERRITIN 114 04/26/2021 1436   FERRITIN 36 08/23/2017 0000   IRONPCTSAT 17 04/26/2021 1436   IRONPCTSAT 6 (L) 12/30/2020 1457       RADIOGRAPHIC STUDIES: I have personally reviewed the radiological images as listed and agreed with the findings in the report. No results found.    ASSESSMENT & PLAN:  1. Iron deficiency anemia due to chronic blood loss    Iron deficiency  anemia.  S/p IV venofer treatments.  Labs are reviewed and discussed with patient. Normal hemoglobin and iron panel.  Hold additional IV venofer.  Previous BRBPR was due to annal fissure.   Follow-up in 6 months. Orders Placed This Encounter  Procedures   CBC with Differential/Platelet    Standing Status:   Future    Standing Expiration Date:   04/29/2022   Iron and TIBC    Standing Status:   Future    Standing Expiration Date:   04/29/2022   Ferritin    Standing Status:   Future    Standing Expiration Date:   04/29/2022    All questions were answered. The patient knows to call the clinic with any problems questions or concerns.  cc Crecencio Mc, MD   Earlie Server, MD, PhD [facility] Hematology Oncology 04/28/2021

## 2021-05-17 ENCOUNTER — Other Ambulatory Visit: Payer: Self-pay

## 2021-05-17 ENCOUNTER — Encounter: Payer: Self-pay | Admitting: Oncology

## 2021-05-17 ENCOUNTER — Ambulatory Visit: Payer: BC Managed Care – PPO | Admitting: Gastroenterology

## 2021-05-17 ENCOUNTER — Encounter: Payer: Self-pay | Admitting: Gastroenterology

## 2021-05-17 VITALS — BP 138/85 | HR 77 | Temp 98.8°F | Ht 65.0 in | Wt 165.2 lb

## 2021-05-17 DIAGNOSIS — R1013 Epigastric pain: Secondary | ICD-10-CM | POA: Diagnosis not present

## 2021-05-17 DIAGNOSIS — G8929 Other chronic pain: Secondary | ICD-10-CM | POA: Diagnosis not present

## 2021-05-17 NOTE — Progress Notes (Signed)
?  ?Makayla Repress, MD ?[address]  ?[address]  ?[place], [place] [address]  ?Main: [phone]  ?Fax: [phone] ? ? ? ?Gastroenterology Consultation ? ?Referring Provider:     Sherlene Shams, MD ?Primary Care Physician:  Sherlene Shams, MD ?Primary Gastroenterologist:  Dr. Maximino Greenland ?Reason for Consultation:     Epigastric pain, iron deficiency anemia ?      ? HPI:   ?Makayla Calderon is a 26 y.o. female referred by Dr. Sherlene Shams, MD  for consultation & management of iron deficiency anemia, chronic epigastric discomfort.  Patient underwent bidirectional endoscopy along with video capsule endoscopy which were unremarkable.  She had focal intestinal metaplasia without dysplasia, H. pylori IgG negative.  Iron deficiency anemia has currently resolved.  Patient is a senior in the nursing school, has significant amount of stress.  She reports ongoing epigastric discomfort which is worse postprandial, more or less constant.  She is on omeprazole 40 mg daily which provides significant relief.  Patient denies any heartburn, regurgitation.  She tried to 20 mg which worsened her symptoms.  She underwent right upper quadrant ultrasound in 12/2019 which was unremarkable.  Patient reports consuming sweet tea, red meat few times a week.  She denies any weight loss, loss of appetite, nausea or vomiting.  She reports having regular bowel movements. ? ?NSAIDs: None ? ?Antiplts/Anticoagulants/Anti thrombotics: None ? ?GI Procedures:  ?EGD and colonoscopy 01/30/2021 ?DIAGNOSIS:  ?A. DUODENUM; COLD BIOPSY:  ?- UNREMARKABLE DUODENAL MUCOSA.  ?- NEGATIVE FOR FEATURES OF CELIAC, DYSPLASIA, AND MALIGNANCY.  ? ?B.  STOMACH; COLD BIOPSY:  ?- ANTRAL MUCOSA WITH MILD REACTIVE GASTRITIS AND FOCAL INTESTINAL  ?METAPLASIA.  ?- UNREMARKABLE OXYNTIC MUCOSA.  ?- An immunohistochemical stain for H.  Pylori was performed on part B and  ?is negative.  ?- NEGATIVE FOR DYSPLASIA AND MALIGNANCY.  ? ?C.  TERMINAL ILEUM; COLD BIOPSY:  ?-  SMALL INTESTINAL MUCOSA WITH PROMINENT REACTIVE LYMPHOID AGGREGATES.  ?- NEGATIVE FOR ACTIVE ENTERITIS, DYSPLASIA, AND MALIGNANCY.  ? ?Past Medical History:  ?Diagnosis Date  ? Allergy   ? Anxiety   ? Headache   ? stress. approx every other day.  ? ? ?Past Surgical History:  ?Procedure Laterality Date  ? BIOPSY N/A 01/30/2021  ? Procedure: BIOPSY;  Surgeon: Pasty Spillers, MD;  Location: [facility];  Service: Endoscopy;  Laterality: N/A;  ? COLONOSCOPY WITH PROPOFOL N/A 01/30/2021  ? Procedure: COLONOSCOPY WITH PROPOFOL;  Surgeon: Pasty Spillers, MD;  Location: [facility];  Service: Endoscopy;  Laterality: N/A;  ? ESOPHAGOGASTRODUODENOSCOPY (EGD) WITH PROPOFOL N/A 01/30/2021  ? Procedure: ESOPHAGOGASTRODUODENOSCOPY (EGD) WITH PROPOFOL;  Surgeon: Pasty Spillers, MD;  Location: [facility];  Service: Endoscopy;  Laterality: N/A;  ? GIVENS CAPSULE STUDY N/A 02/28/2021  ? Procedure: GIVENS CAPSULE STUDY;  Surgeon: Pasty Spillers, MD;  Location: [facility];  Service: Endoscopy;  Laterality: N/A;  ? TONSILLECTOMY  2006  ? ? ?Current Outpatient Medications:  ?  cetirizine (ZYRTEC) 10 MG tablet, Take 10 mg by mouth daily., Disp: , Rfl:  ?  docusate sodium (COLACE) 100 MG capsule, Take 100 mg by mouth daily., Disp: , Rfl:  ?  fluticasone (FLONASE) 50 MCG/ACT nasal spray, Place 1 spray into both nostrils daily., Disp: , Rfl:  ?  Multiple Vitamins-Minerals (MULTIVITAMIN ADULT PO), Take 1 tablet by mouth daily., Disp: , Rfl:  ?  norgestimate-ethinyl estradiol (ORTHO-CYCLEN) 0.25-35 MG-MCG tablet, TAKE 1 TABLET BY MOUTH EVERY DAY, Disp: 84  tablet, Rfl: 3 ?  omeprazole (PRILOSEC) 40 MG capsule, Take 1 capsule (40 mg total) by mouth daily., Disp: 30 capsule, Rfl: 3 ?  propranolol (INDERAL) 10 MG tablet, Take 1 tablet (10 mg total) by mouth 3 (three) times daily. As needed, Disp: 30 tablet, Rfl: 0 ?  sertraline (ZOLOFT) 50 MG tablet, TAKE 1 TABLET BY MOUTH EVERY DAY, Disp: 90 tablet,  Rfl: 2 ? ? ? ?Family History  ?Problem Relation Age of Onset  ? Asthma Mother   ? Cancer Father   ?     skin cancer  ? High Cholesterol Father   ? Cancer Brother   ?     ewing sarcoma  ? Arthritis Maternal Grandmother   ? Asthma Maternal Grandmother   ? Cancer Maternal Grandmother   ? Hearing loss Maternal Grandmother   ? Hyperlipidemia Maternal Grandmother   ? Hypertension Maternal Grandmother   ? Cancer Maternal Grandfather   ? Diabetes Maternal Grandfather   ? Heart disease Maternal Grandfather   ? Hyperlipidemia Maternal Grandfather   ? Hypertension Maternal Grandfather   ? Cancer Paternal Grandmother   ? Heart attack Paternal Grandmother   ? Kidney disease Paternal Grandmother   ? Early death Paternal Grandfather   ? Heart attack Paternal Grandfather   ?  ? ?Social History  ? ?Tobacco Use  ? Smoking status: Never  ? Smokeless tobacco: Never  ?Vaping Use  ? Vaping Use: Never used  ?Substance Use Topics  ? Alcohol use: Yes  ?  Comment: occasional  ? Drug use: Never  ? ? ?Allergies as of 05/17/2021 - Review Complete 05/17/2021  ?Allergen Reaction Noted  ? Wellbutrin [bupropion]  11/29/2020  ? ? ?Review of Systems:    ?All systems reviewed and negative except where noted in HPI. ? ? Physical Exam:  ?BP 138/85 (BP Location: Left Arm, Patient Position: Sitting, Cuff Size: Normal)   Pulse 77   Temp 98.8 ?F (37.1 ?C) (Oral)   Ht 5\' 5"  (1.651 m)   Wt 165 lb 4 oz (75 kg)   BMI 27.50 kg/m?  ?No LMP recorded. ? ?General:   Alert,  Well-developed, well-nourished, pleasant and cooperative in NAD ?Head:  Normocephalic and atraumatic. ?Eyes:  Sclera clear, no icterus.   Conjunctiva pink. ?Ears:  Normal auditory acuity. ?Nose:  No deformity, discharge, or lesions. ?Mouth:  No deformity or lesions,oropharynx pink & moist. ?Neck:  Supple; no masses or thyromegaly. ?Lungs:  Respirations even and unlabored.  Clear throughout to auscultation.   No wheezes, crackles, or rhonchi. No acute distress. ?Heart:  Regular rate and  rhythm; no murmurs, clicks, rubs, or gallops. ?Abdomen:  Normal bowel sounds. Soft, non-tender and non-distended without masses, hepatosplenomegaly or hernias noted.  No guarding or rebound tenderness.   ?Rectal: Not performed ?Msk:  Symmetrical without gross deformities. Good, equal movement & strength bilaterally. ?Pulses:  Normal pulses noted. ?Extremities:  No clubbing or edema.  No cyanosis. ?Neurologic:  Alert and oriented x3;  grossly normal neurologically. ?Skin:  Intact without significant lesions or rashes. No jaundice. ?Psych:  Alert and cooperative. Normal mood and affect. ? ?Imaging Studies: ?Reviewed ? ?Assessment and Plan:  ? ?Clemens CatholicKasey M Mcneary is a 26 y.o. pleasant Caucasian female with history of chronic epigastric discomfort, iron deficiency anemia ? ?Chronic epigastric discomfort: ?EGD revealed focal intestinal metaplasia, H. pylori negative ?H. pylori IgG negative ?Right abdominal ultrasound in 12/2019 was normal ?Continue omeprazole 40 mg daily for now, decrease to 20 mg daily after diet modification.  Strongly advised patient to cut back on consumption of sweet tea, red meat, processed foods, incorporate regular exercise.  If her symptoms are still persistent, recommend HIDA scan, food allergy profile, alpha gal panel ? ?Iron deficiency anemia: Currently resolved ?Bidirectional endoscopy, video capsule endoscopy negative ?Patient responded to iron replacement therapy ? ? ?Follow up via MyChart if symptoms are persistent ? ? ?Makayla Repress, MD ? ?

## 2021-06-26 ENCOUNTER — Other Ambulatory Visit: Payer: Self-pay

## 2021-06-26 ENCOUNTER — Other Ambulatory Visit: Payer: Self-pay | Admitting: Internal Medicine

## 2021-06-26 MED ORDER — PROPRANOLOL HCL 10 MG PO TABS: 10.0000 mg | ORAL_TABLET | Freq: Three times a day (TID) | ORAL | 0 refills | Status: AC

## 2021-06-26 MED ORDER — OMEPRAZOLE 40 MG PO CPDR: 40.0000 mg | DELAYED_RELEASE_CAPSULE | Freq: Every day | ORAL | 1 refills | Status: AC

## 2021-06-26 NOTE — Telephone Encounter (Signed)
Received a refill request for omeprazole 40mg  daily. Patient was last seen on 05/17/21 ?

## 2021-09-10 IMAGING — US US ABDOMEN LIMITED
1 series · 14 of 25 positions shown · non-contrast
Comparison: CT abdomen pelvis 12/29/2005

CLINICAL DATA: Abdominal pain, nausea and vomiting

EXAM:
ULTRASOUND ABDOMEN LIMITED RIGHT UPPER QUADRANT

[Series 1: us abdomen limited · 0.20mm/px · 14 of 49 slices shown]
[im 1/49]
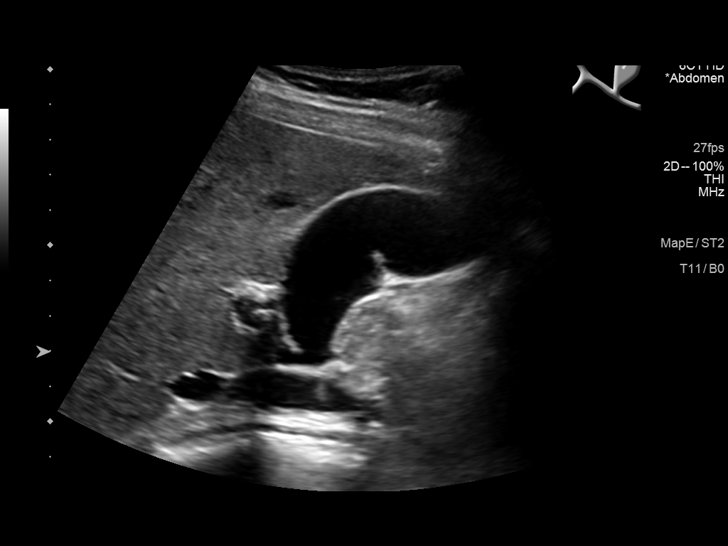
[im 5/49]
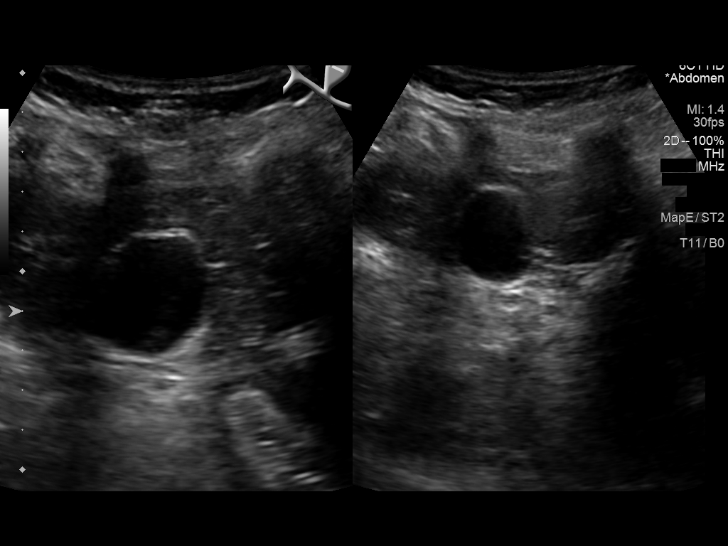
[im 9/49]
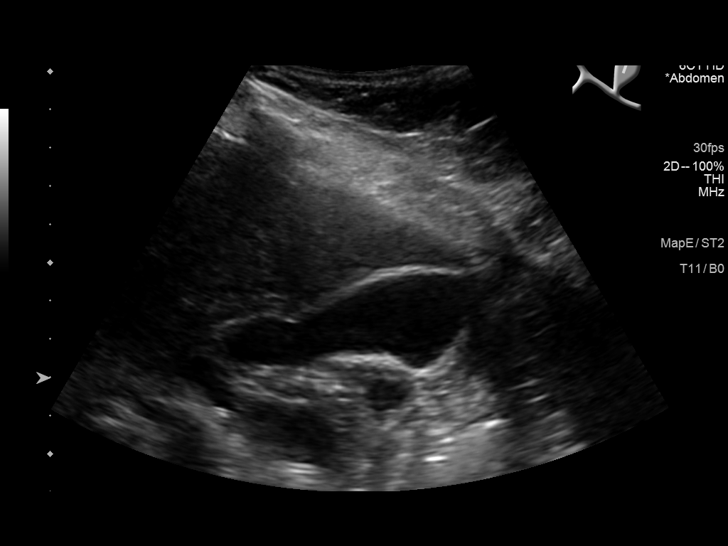
[im 13/49]
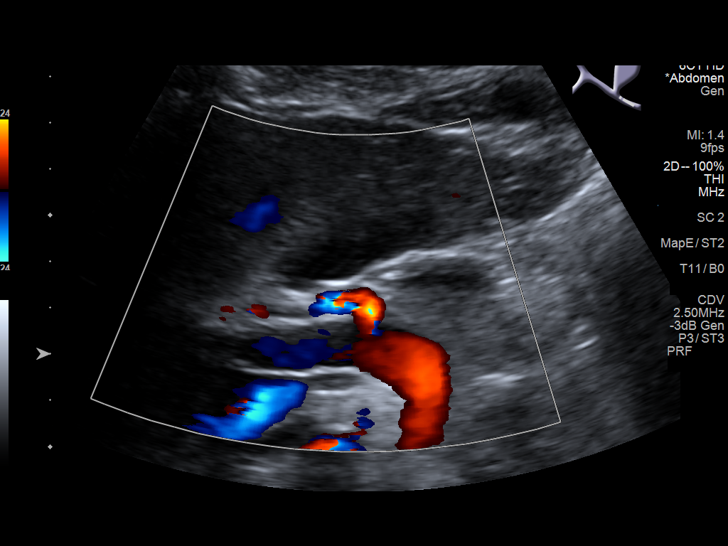
[im 17/49]
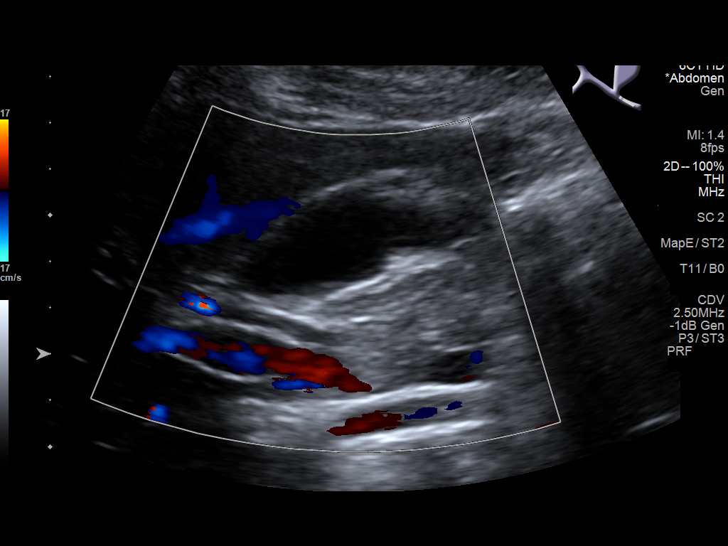
[im 19/49]
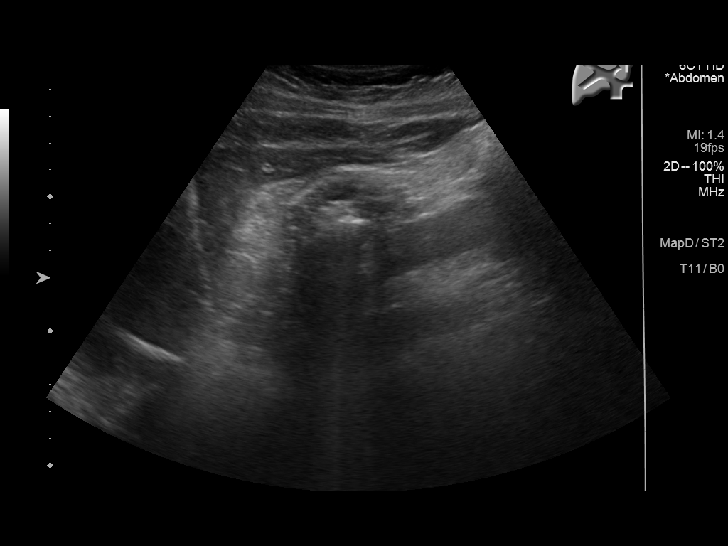
[im 23/49]
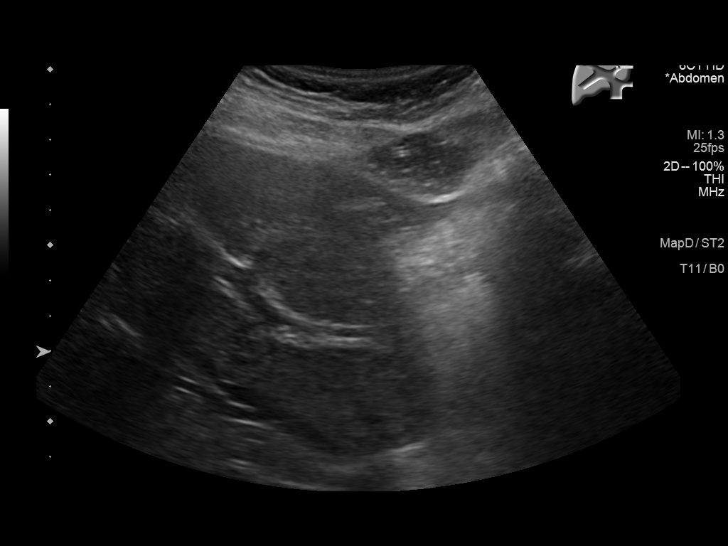
[im 27/49]
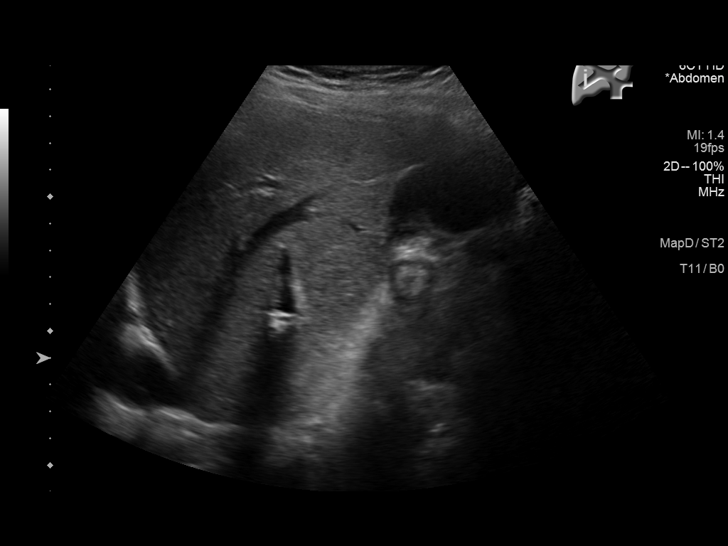
[im 31/49]
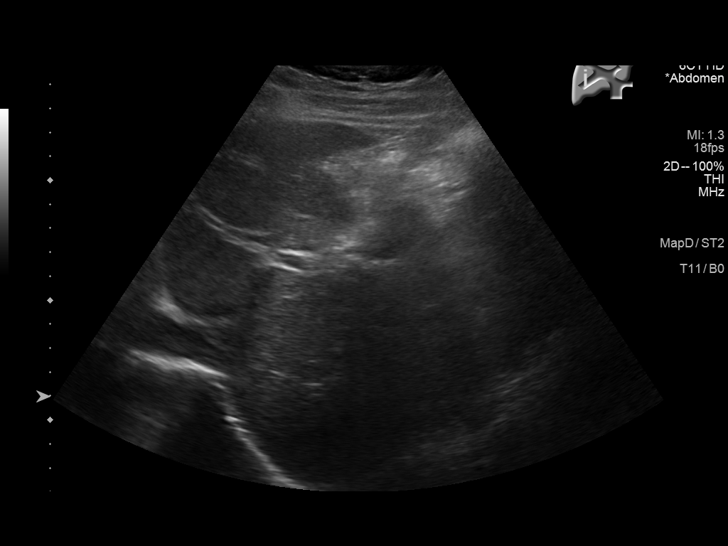
[im 33/49]
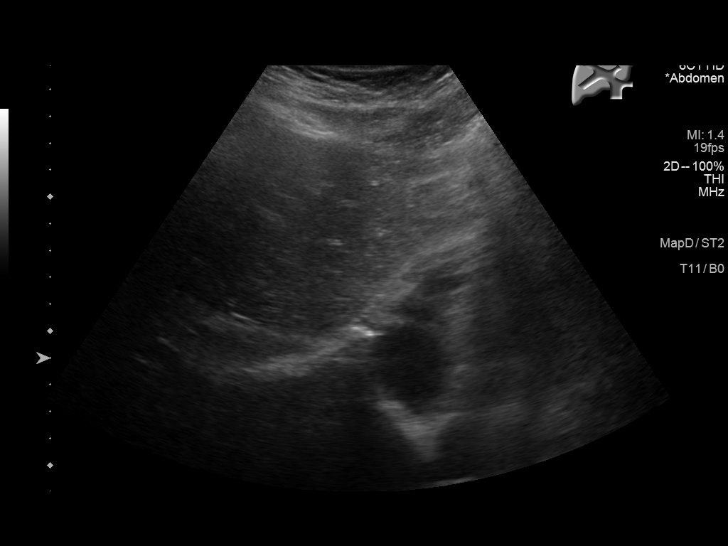
[im 37/49]
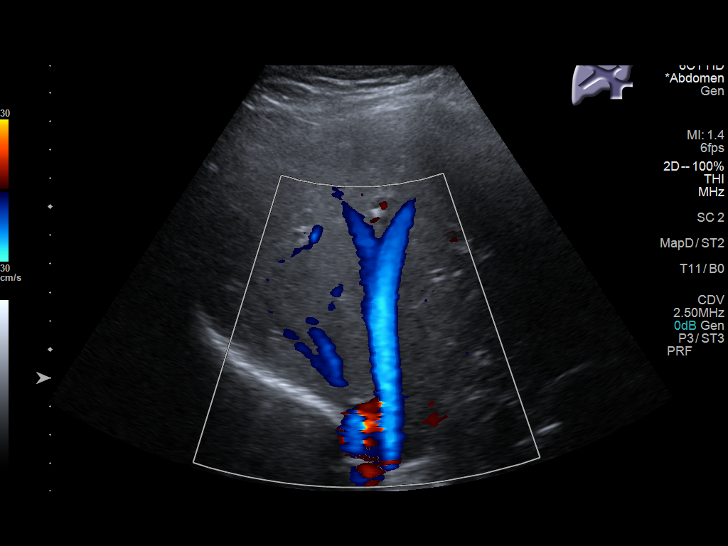
[im 41/49]
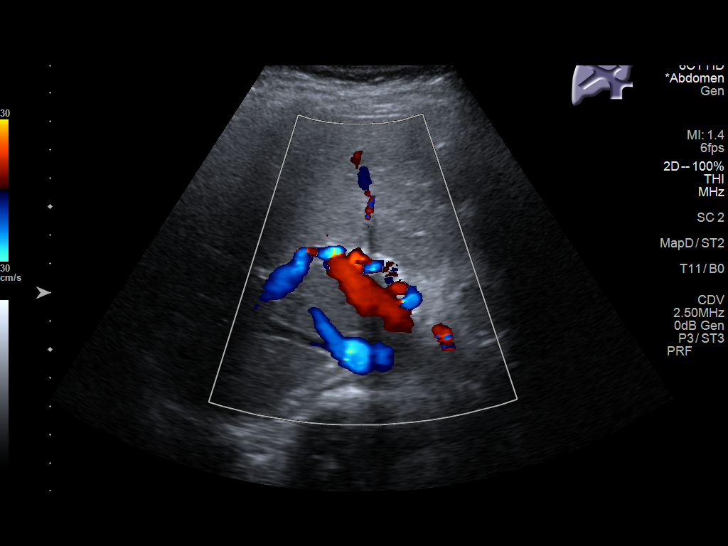
[im 45/49]
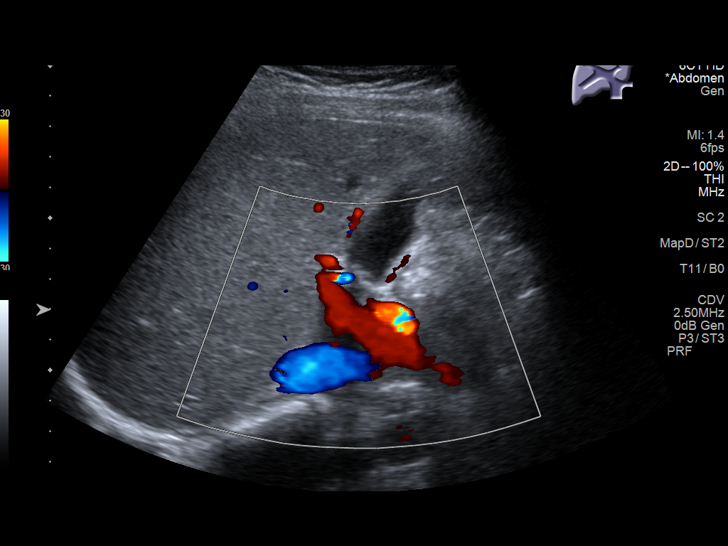
[im 49/49]
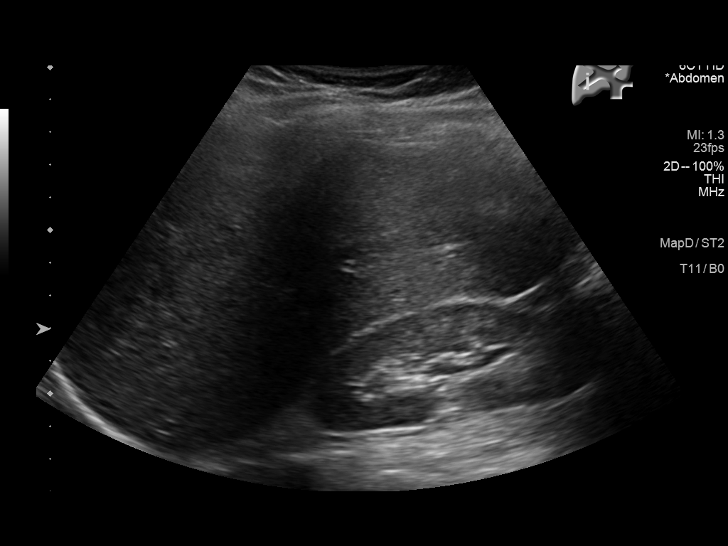

[14 of 25 positions shown; findings below may reference images not displayed]

FINDINGS: Gallbladder:

No gallstones or wall thickening visualized. No sonographic Murphy
sign noted by sonographer.

Common bile duct:

Diameter: 0.3 cm, within normal limits.

Liver:

No focal lesion identified. Within normal limits in parenchymal
echogenicity. Portal vein is patent on color Doppler imaging with
normal direction of blood flow towards the liver.

Other: None.
IMPRESSION: No sonographic finding to explain the patient's abdominal pain.

## 2021-09-22 ENCOUNTER — Other Ambulatory Visit: Payer: Self-pay | Admitting: Internal Medicine

## 2021-10-31 ENCOUNTER — Inpatient Hospital Stay: Payer: BC Managed Care – PPO | Attending: Oncology

## 2021-10-31 DIAGNOSIS — E7800 Pure hypercholesterolemia, unspecified: Secondary | ICD-10-CM | POA: Diagnosis not present

## 2021-10-31 DIAGNOSIS — Z79899 Other long term (current) drug therapy: Secondary | ICD-10-CM | POA: Diagnosis not present

## 2021-10-31 DIAGNOSIS — E785 Hyperlipidemia, unspecified: Secondary | ICD-10-CM | POA: Diagnosis not present

## 2021-10-31 DIAGNOSIS — D500 Iron deficiency anemia secondary to blood loss (chronic): Secondary | ICD-10-CM

## 2021-10-31 DIAGNOSIS — D509 Iron deficiency anemia, unspecified: Secondary | ICD-10-CM | POA: Insufficient documentation

## 2021-10-31 DIAGNOSIS — I10 Essential (primary) hypertension: Secondary | ICD-10-CM | POA: Insufficient documentation

## 2021-10-31 LAB — CBC WITH DIFFERENTIAL/PLATELET
Abs Immature Granulocytes: 0.01 10*3/uL (ref 0.00–0.07)
Basophils Absolute: 0.1 10*3/uL (ref 0.0–0.1)
Basophils Relative: 1 %
Eosinophils Absolute: 0.1 10*3/uL (ref 0.0–0.5)
Eosinophils Relative: 1 %
HCT: 42.7 % (ref 36.0–46.0)
Hemoglobin: 14.1 g/dL (ref 12.0–15.0)
Immature Granulocytes: 0 %
Lymphocytes Relative: 35 %
Lymphs Abs: 2.2 10*3/uL (ref 0.7–4.0)
MCH: 28.6 pg (ref 26.0–34.0)
MCHC: 33 g/dL (ref 30.0–36.0)
MCV: 86.6 fL (ref 80.0–100.0)
Monocytes Absolute: 0.3 10*3/uL (ref 0.1–1.0)
Monocytes Relative: 5 %
Neutro Abs: 3.6 10*3/uL (ref 1.7–7.7)
Neutrophils Relative %: 58 %
Platelets: 333 10*3/uL (ref 150–400)
RBC: 4.93 MIL/uL (ref 3.87–5.11)
RDW: 12.5 % (ref 11.5–15.5)
WBC: 6.2 10*3/uL (ref 4.0–10.5)
nRBC: 0 % (ref 0.0–0.2)

## 2021-10-31 LAB — IRON AND TIBC
Iron: 120 ug/dL (ref 28–170)
Saturation Ratios: 29 % (ref 10.4–31.8)
TIBC: 412 ug/dL (ref 250–450)
UIBC: 292 ug/dL

## 2021-10-31 LAB — FERRITIN: Ferritin: 90 ng/mL (ref 11–307)

## 2021-11-02 ENCOUNTER — Inpatient Hospital Stay (HOSPITAL_BASED_OUTPATIENT_CLINIC_OR_DEPARTMENT_OTHER): Payer: BC Managed Care – PPO | Admitting: Oncology

## 2021-11-02 ENCOUNTER — Inpatient Hospital Stay: Payer: BC Managed Care – PPO | Admitting: Oncology

## 2021-11-02 ENCOUNTER — Inpatient Hospital Stay: Payer: BC Managed Care – PPO

## 2021-11-02 ENCOUNTER — Encounter: Payer: Self-pay | Admitting: Oncology

## 2021-11-02 DIAGNOSIS — Z862 Personal history of diseases of the blood and blood-forming organs and certain disorders involving the immune mechanism: Secondary | ICD-10-CM

## 2021-11-02 DIAGNOSIS — D509 Iron deficiency anemia, unspecified: Secondary | ICD-10-CM | POA: Diagnosis not present

## 2021-11-02 NOTE — Progress Notes (Signed)
HEMATOLOGY-ONCOLOGY TeleHEALTH VISIT PROGRESS NOTE  I connected with Makayla Calderon on 11/02/21  at  3:30 PM EDT by video enabled telemedicine visit and verified that I am speaking with the correct person using two identifiers. I discussed the limitations, risks, security and privacy concerns of performing an evaluation and management service by telemedicine and the availability of in-person appointments. The patient expressed understanding and agreed to proceed.   Other persons participating in the visit and their role in the encounter:  None  Patient's location: Home  Provider's location: office Chief Complaint: Iron deficiency anemia   INTERVAL [name] is a 26 y.o. female who has above history reviewed by me today presents for follow up visit for iron deficiency anemia. Patient reports feeling well.  No new complaints.  Review of Systems  Constitutional:  Negative for appetite change, fatigue and unexpected weight change.  Respiratory:  Negative for shortness of breath.   Cardiovascular:  Negative for chest pain.  Gastrointestinal:  Negative for abdominal pain.  Genitourinary:  Negative for dysuria.   Musculoskeletal:  Negative for arthralgias.  Skin:  Negative for rash.  Neurological:  Negative for light-headedness.  Hematological:  Negative for adenopathy.  Psychiatric/Behavioral:  Negative for confusion.     Past Medical History:  Diagnosis Date   Allergy    Anxiety    Headache    stress. approx every other day.   Iron deficiency anemia due to chronic blood loss 01/27/2021   Past Surgical History:  Procedure Laterality Date   BIOPSY N/A 01/30/2021   Procedure: BIOPSY;  Surgeon: Pasty Spillers, MD;  Location: [facility];  Service: Endoscopy;  Laterality: N/A;   COLONOSCOPY WITH PROPOFOL N/A 01/30/2021   Procedure: COLONOSCOPY WITH PROPOFOL;  Surgeon: Pasty Spillers, MD;  Location: [facility];  Service: Endoscopy;  Laterality: N/A;    ESOPHAGOGASTRODUODENOSCOPY (EGD) WITH PROPOFOL N/A 01/30/2021   Procedure: ESOPHAGOGASTRODUODENOSCOPY (EGD) WITH PROPOFOL;  Surgeon: Pasty Spillers, MD;  Location: [facility];  Service: Endoscopy;  Laterality: N/A;   GIVENS CAPSULE STUDY N/A 02/28/2021   Procedure: GIVENS CAPSULE STUDY;  Surgeon: Pasty Spillers, MD;  Location: [facility];  Service: Endoscopy;  Laterality: N/A;   TONSILLECTOMY  2006    Family History  Problem Relation Age of Onset   Asthma Mother    Cancer Father        skin cancer   High Cholesterol Father    Cancer Brother        ewing sarcoma   Arthritis Maternal Grandmother    Asthma Maternal Grandmother    Cancer Maternal Grandmother    Hearing loss Maternal Grandmother    Hyperlipidemia Maternal Grandmother    Hypertension Maternal Grandmother    Cancer Maternal Grandfather    Diabetes Maternal Grandfather    Heart disease Maternal Grandfather    Hyperlipidemia Maternal Grandfather    Hypertension Maternal Grandfather    Cancer Paternal Grandmother    Heart attack Paternal Grandmother    Kidney disease Paternal Grandmother    Early death Paternal Grandfather    Heart attack Paternal Grandfather     Social History   Socioeconomic History   Marital status: Single    Spouse name: Not on file   Number of children: Not on file   Years of education: Not on file   Highest education level: Not on file  Occupational History   Not on file  Tobacco Use   Smoking status: Never   Smokeless tobacco:  Never  Vaping Use   Vaping Use: Never used  Substance and Sexual Activity   Alcohol use: Yes    Comment: occasional   Drug use: Never   Sexual activity: Not Currently    Birth control/protection: Pill  Other Topics Concern   Not on file  Social History Narrative   Not on file   Social Determinants of Health   Financial Resource Strain: Not on file  Food Insecurity: Not on file  Transportation Needs: Not on file  Physical  Activity: Not on file  Stress: Not on file  Social Connections: Not on file  Intimate Partner Violence: Not on file    Current Outpatient Medications on File Prior to Visit  Medication Sig Dispense Refill   cetirizine (ZYRTEC) 10 MG tablet Take 10 mg by mouth daily.     docusate sodium (COLACE) 100 MG capsule Take 100 mg by mouth daily.     fluticasone (FLONASE) 50 MCG/ACT nasal spray Place 1 spray into both nostrils daily.     Multiple Vitamins-Minerals (MULTIVITAMIN ADULT PO) Take 1 tablet by mouth daily.     norgestimate-ethinyl estradiol (ORTHO-CYCLEN) 0.25-35 MG-MCG tablet TAKE 1 TABLET BY MOUTH EVERY DAY 84 tablet 3   omeprazole (PRILOSEC) 40 MG capsule Take 1 capsule (40 mg total) by mouth daily. 90 capsule 1   propranolol (INDERAL) 10 MG tablet Take 1 tablet (10 mg total) by mouth 3 (three) times daily. As needed 30 tablet 0   sertraline (ZOLOFT) 50 MG tablet TAKE 1 TABLET BY MOUTH EVERY DAY 90 tablet 2   No current facility-administered medications on file prior to visit.    Allergies  Allergen Reactions   Wellbutrin [Bupropion]     Made emotional, caused headaches, increased appetitie       Observations/Objective: There were no vitals filed for this visit. There is no height or weight on file to calculate BMI.  Physical Exam Neurological:     Mental Status: She is alert.     Labs    Latest Ref Rng & Units 10/31/2021   11:00 AM 04/26/2021    2:36 PM 12/30/2020    2:57 PM  CBC  WBC 4.0 - 10.5 K/uL 6.2  8.5  7.6   Hemoglobin 12.0 - 15.0 g/dL 65.6  81.2  75.1   Hematocrit 36.0 - 46.0 % 42.7  40.4  36.1   Platelets 150 - 400 K/uL 333  311  320       Latest Ref Rng & Units 10/06/2020    8:53 AM 09/18/2019    3:53 PM 08/26/2018    3:25 PM  CMP  Glucose 70 - 99 mg/dL 87  79  85   BUN 6 - 23 mg/dL 10  11  10    Creatinine 0.40 - 1.20 mg/dL  7.00  1.74   Sodium 135 - 145 mEq/L 139  138  140   Potassium 3.5 - 5.1 mEq/L 4.2  4.0  4.2   Chloride 96 - 112 mEq/L 104   104  107   CO2 19 - 32 mEq/L 23  24  24    Calcium 8.4 - 10.5 mg/dL 9.8  9.8  9.1   Total Protein 6.0 - 8.3 g/dL 7.3  6.9  6.8   Total Bilirubin 0.2 - 1.2 mg/dL 0.4  0.3  0.3   Alkaline Phos 39 - 117 U/L 67   40   AST 0 - 37 U/L 13  12  19    ALT 0 -  35 U/L 9  8  11        Assessment and Plan: 1. History of iron deficiency anemia     Labs reviewed and discussed with patient. Hemoglobin remains normal and stable.  Iron panel remained stable. No need for iron supplementation.  Recommend patient to follow-up with primary care provider and have CBC checked annually.  Patient will be discharged from hematology clinic.  I discussed the assessment and treatment plan with the patient. The patient was provided an opportunity to ask questions and all were answered. The patient agreed with the plan and demonstrated an understanding of the instructions.  The patient was advised to call back or seek an in-person evaluation if the symptoms worsen or if the condition fails to improve as anticipated.   , MD 11/02/2021 9:39 PM

## 2021-12-21 ENCOUNTER — Other Ambulatory Visit: Payer: Self-pay | Admitting: Gastroenterology

## 2021-12-21 NOTE — Telephone Encounter (Signed)
Last office visit 05/17/2021  Plan Continue omeprazole 40 mg daily for now, decrease to 20 mg daily after diet modification.  Strongly advised patient to cut back on consumption of sweet tea, red meat, processed foods, incorporate regular exercise.  If her symptoms are still persistent, recommend HIDA scan, food allergy profile, alpha gal panel  Last refill 06/26/2021 90 capsules 1 refill  Do you want her to be on 20mg  now

## 2021-12-21 NOTE — Telephone Encounter (Signed)
Called and left a  message to inform patient to go down to 20mg  of the omeprazole

## 2022-01-17 ENCOUNTER — Other Ambulatory Visit: Payer: Self-pay | Admitting: Internal Medicine

## 2022-01-17 NOTE — Telephone Encounter (Signed)
Waiting on my chart response.

## 2022-01-23 NOTE — Telephone Encounter (Signed)
Pt responded and stated that she has moved does not need refilled.

## 2022-01-23 NOTE — Telephone Encounter (Signed)
Medication has been refused and pt has been removed from Dr. Darrick Huntsman patient panel.

## 2022-02-07 NOTE — Telephone Encounter (Signed)
Error

## 2022-02-07 NOTE — Telephone Encounter (Signed)
MyChart messgae sent to patient. 

## 2023-02-25 ENCOUNTER — Encounter: Payer: Self-pay | Admitting: Oncology
